# Patient Record
Sex: Female | Born: 1955 | Race: Black or African American | Hispanic: No | Marital: Single | Smoking: Never smoker
Health system: Southern US, Community
[De-identification: ages and names within clinical notes are randomized; demographics above are authoritative.]

## PROBLEM LIST (undated history)

## (undated) DIAGNOSIS — E7800 Pure hypercholesterolemia, unspecified: Secondary | ICD-10-CM

## (undated) DIAGNOSIS — M329 Systemic lupus erythematosus, unspecified: Secondary | ICD-10-CM

## (undated) DIAGNOSIS — I219 Acute myocardial infarction, unspecified: Secondary | ICD-10-CM

## (undated) DIAGNOSIS — I2510 Atherosclerotic heart disease of native coronary artery without angina pectoris: Secondary | ICD-10-CM

## (undated) DIAGNOSIS — K5732 Diverticulitis of large intestine without perforation or abscess without bleeding: Secondary | ICD-10-CM

## (undated) DIAGNOSIS — IMO0002 Reserved for concepts with insufficient information to code with codable children: Secondary | ICD-10-CM

## (undated) DIAGNOSIS — M719 Bursopathy, unspecified: Secondary | ICD-10-CM

## (undated) HISTORY — PX: OVARY SURGERY: SHX727

## (undated) HISTORY — PX: CHOLECYSTECTOMY: SHX55

## (undated) HISTORY — PX: FOOT SURGERY: SHX648

## (undated) HISTORY — PX: ROTATOR CUFF REPAIR: SHX139

---

## 2006-02-06 ENCOUNTER — Encounter: Admission: RE | Admit: 2006-02-06 | Discharge: 2006-05-07 | Payer: Self-pay | Admitting: "Specialist/Technologist

## 2007-07-18 ENCOUNTER — Emergency Department (HOSPITAL_COMMUNITY): Admission: EM | Admit: 2007-07-18 | Discharge: 2007-07-19 | Payer: Self-pay | Admitting: Emergency Medicine

## 2008-10-09 ENCOUNTER — Emergency Department (HOSPITAL_BASED_OUTPATIENT_CLINIC_OR_DEPARTMENT_OTHER): Admission: EM | Admit: 2008-10-09 | Discharge: 2008-10-10 | Payer: Self-pay | Admitting: Emergency Medicine

## 2009-08-18 ENCOUNTER — Ambulatory Visit: Payer: Self-pay | Admitting: Diagnostic Radiology

## 2009-08-18 ENCOUNTER — Emergency Department (HOSPITAL_BASED_OUTPATIENT_CLINIC_OR_DEPARTMENT_OTHER): Admission: EM | Admit: 2009-08-18 | Discharge: 2009-08-18 | Payer: Self-pay | Admitting: Emergency Medicine

## 2009-11-14 ENCOUNTER — Emergency Department (HOSPITAL_BASED_OUTPATIENT_CLINIC_OR_DEPARTMENT_OTHER): Admission: EM | Admit: 2009-11-14 | Discharge: 2009-11-15 | Payer: Self-pay | Admitting: Emergency Medicine

## 2010-07-01 LAB — URINALYSIS, ROUTINE W REFLEX MICROSCOPIC
Bilirubin Urine: NEGATIVE
Hgb urine dipstick: NEGATIVE
Ketones, ur: NEGATIVE mg/dL
Protein, ur: NEGATIVE mg/dL
Specific Gravity, Urine: 1.015 (ref 1.005–1.030)
pH: 7 (ref 5.0–8.0)

## 2010-07-01 LAB — DIFFERENTIAL
Basophils Relative: 3 % — ABNORMAL HIGH (ref 0–1)
Eosinophils Absolute: 0.1 10*3/uL (ref 0.0–0.7)
Lymphocytes Relative: 38 % (ref 12–46)
Monocytes Relative: 6 % (ref 3–12)

## 2010-07-01 LAB — COMPREHENSIVE METABOLIC PANEL
AST: 30 U/L (ref 0–37)
Alkaline Phosphatase: 66 U/L (ref 39–117)
CO2: 30 mEq/L (ref 19–32)
Calcium: 8.9 mg/dL (ref 8.4–10.5)
Chloride: 105 mEq/L (ref 96–112)
Potassium: 4.3 mEq/L (ref 3.5–5.1)
Total Bilirubin: 1.3 mg/dL — ABNORMAL HIGH (ref 0.3–1.2)
Total Protein: 7.6 g/dL (ref 6.0–8.3)

## 2010-07-01 LAB — CBC
MCH: 31.4 pg (ref 26.0–34.0)
Platelets: 195 10*3/uL (ref 150–400)
RDW: 14.4 % (ref 11.5–15.5)

## 2010-07-01 LAB — LIPASE, BLOOD: Lipase: 94 U/L (ref 23–300)

## 2010-07-01 LAB — URINE MICROSCOPIC-ADD ON

## 2010-07-05 LAB — POCT CARDIAC MARKERS
CKMB, poc: <1 ng/mL — ABNORMAL LOW (ref 1.0–8.0)
Myoglobin, poc: 45.8 ng/mL (ref 12–200)

## 2010-07-05 LAB — COMPREHENSIVE METABOLIC PANEL
ALT: 22 U/L (ref 0–35)
AST: 19 U/L (ref 0–37)
Alkaline Phosphatase: 89 U/L (ref 39–117)
BUN: 11 mg/dL (ref 6–23)
CO2: 26 mEq/L (ref 19–32)
Creatinine, Ser: 0.9 mg/dL (ref 0.4–1.2)
Potassium: 4.3 mEq/L (ref 3.5–5.1)
Total Bilirubin: 0.7 mg/dL (ref 0.3–1.2)
Total Protein: 7.9 g/dL (ref 6.0–8.3)

## 2010-07-05 LAB — URINE MICROSCOPIC-ADD ON

## 2010-07-05 LAB — CBC
HCT: 43.3 % (ref 36.0–46.0)
Hemoglobin: 14.1 g/dL (ref 12.0–15.0)
MCHC: 32.6 g/dL (ref 30.0–36.0)
Platelets: 232 10*3/uL (ref 150–400)
RBC: 4.59 MIL/uL (ref 3.87–5.11)

## 2010-07-05 LAB — URINALYSIS, ROUTINE W REFLEX MICROSCOPIC
Bilirubin Urine: NEGATIVE
Glucose, UA: NEGATIVE mg/dL
Hgb urine dipstick: NEGATIVE
Ketones, ur: NEGATIVE mg/dL

## 2010-07-05 LAB — DIFFERENTIAL
Basophils Absolute: 0 10*3/uL (ref 0.0–0.1)
Eosinophils Relative: 1 % (ref 0–5)
Lymphocytes Relative: 49 % — ABNORMAL HIGH (ref 12–46)
Lymphs Abs: 3.4 10*3/uL (ref 0.7–4.0)
Monocytes Absolute: 0.5 10*3/uL (ref 0.1–1.0)
Monocytes Relative: 8 % (ref 3–12)
Neutrophils Relative %: 42 % — ABNORMAL LOW (ref 43–77)

## 2010-07-25 LAB — CBC
HCT: 44.1 % (ref 36.0–46.0)
MCV: 93.9 fL (ref 78.0–100.0)
Platelets: 228 10*3/uL (ref 150–400)
RDW: 14.1 % (ref 11.5–15.5)
WBC: 7.6 10*3/uL (ref 4.0–10.5)

## 2010-07-25 LAB — COMPREHENSIVE METABOLIC PANEL
ALT: 19 U/L (ref 0–35)
AST: 21 U/L (ref 0–37)
Calcium: 8.8 mg/dL (ref 8.4–10.5)
Chloride: 105 mEq/L (ref 96–112)
Creatinine, Ser: 1 mg/dL (ref 0.4–1.2)
GFR calc Af Amer: >60 mL/min (ref >60)
GFR calc non Af Amer: 58 mL/min — ABNORMAL LOW (ref >60)
Sodium: 143 mEq/L (ref 135–145)
Total Protein: 7.4 g/dL (ref 6.0–8.3)

## 2010-07-25 LAB — URINALYSIS, ROUTINE W REFLEX MICROSCOPIC
Glucose, UA: NEGATIVE mg/dL
Hgb urine dipstick: NEGATIVE
Nitrite: NEGATIVE

## 2010-07-25 LAB — DIFFERENTIAL
Basophils Absolute: 0.3 10*3/uL — ABNORMAL HIGH (ref 0.0–0.1)
Eosinophils Absolute: 0.1 10*3/uL (ref 0.0–0.7)
Lymphocytes Relative: 38 % (ref 12–46)
Monocytes Absolute: 0.6 10*3/uL (ref 0.1–1.0)

## 2010-07-25 LAB — HEMOCCULT GUIAC POC 1CARD (OFFICE): Fecal Occult Bld: NEGATIVE

## 2010-07-25 LAB — LIPASE, BLOOD: Lipase: 179 U/L (ref 23–300)

## 2010-11-29 ENCOUNTER — Other Ambulatory Visit: Payer: Self-pay | Admitting: Specialist

## 2010-11-29 DIAGNOSIS — M545 Low back pain: Secondary | ICD-10-CM

## 2010-11-30 ENCOUNTER — Other Ambulatory Visit: Payer: Self-pay

## 2011-01-10 LAB — CBC
HCT: 44
Hemoglobin: 14.9
MCHC: 33.8
MCV: 93.1
Platelets: 224
RBC: 4.72
RDW: 14.4
WBC: 8.1

## 2011-01-10 LAB — DIFFERENTIAL
Basophils Relative: 0
Eosinophils Absolute: 0.1
Monocytes Absolute: 0.6
Monocytes Relative: 8

## 2011-01-10 LAB — COMPREHENSIVE METABOLIC PANEL
Albumin: 3.9
Alkaline Phosphatase: 80
CO2: 28
GFR calc Af Amer: >60
Potassium: 3.6
Sodium: 136

## 2011-01-10 LAB — POCT CARDIAC MARKERS: Myoglobin, poc: 54

## 2011-03-15 ENCOUNTER — Encounter: Payer: Self-pay | Admitting: *Deleted

## 2011-03-15 ENCOUNTER — Emergency Department (INDEPENDENT_AMBULATORY_CARE_PROVIDER_SITE_OTHER): Payer: Medicare Other

## 2011-03-15 ENCOUNTER — Emergency Department (HOSPITAL_BASED_OUTPATIENT_CLINIC_OR_DEPARTMENT_OTHER)
Admission: EM | Admit: 2011-03-15 | Discharge: 2011-03-15 | Discharge status: Against Medical Advice | Payer: Medicare Other | Attending: Emergency Medicine | Admitting: Emergency Medicine

## 2011-03-15 DIAGNOSIS — R05 Cough: Secondary | ICD-10-CM | POA: Insufficient documentation

## 2011-03-15 DIAGNOSIS — R079 Chest pain, unspecified: Secondary | ICD-10-CM | POA: Insufficient documentation

## 2011-03-15 DIAGNOSIS — R0602 Shortness of breath: Secondary | ICD-10-CM

## 2011-03-15 DIAGNOSIS — R059 Cough, unspecified: Secondary | ICD-10-CM | POA: Insufficient documentation

## 2011-03-15 HISTORY — DX: Bursopathy, unspecified: M71.9

## 2011-03-15 HISTORY — DX: Pure hypercholesterolemia, unspecified: E78.00

## 2011-03-15 NOTE — ED Provider Notes (Signed)
8:21 PM  Date: 03/15/2011  Rate:84  Rhythm: normal sinus rhythm and sinus arrhythmia  QRS Axis: normal  Intervals: normal  ST/T Wave abnormalities: nonspecific ST changes  Conduction Disutrbances:none  Narrative Interpretation: Abnormal EKG.  Old EKG Reviewed: unchanged    Carleene Cooper III, MD 03/15/11 2022

## 2011-03-15 NOTE — ED Notes (Signed)
resp at bs, pt assessed.

## 2011-03-15 NOTE — ED Notes (Signed)
Pt c/o center chest pain with cough. Has ran out of inhaler and needs a home nebulizer per pt.

## 2011-03-15 NOTE — ED Notes (Signed)
Pt not found in room, gown noticed on bed. NS states that she saw pt ambulate out of dept in NAD.

## 2011-03-15 NOTE — ED Notes (Signed)
Patient transported to X-ray 

## 2011-07-25 ENCOUNTER — Other Ambulatory Visit: Payer: Self-pay | Admitting: Specialist

## 2011-07-25 DIAGNOSIS — M545 Low back pain: Secondary | ICD-10-CM

## 2011-07-30 ENCOUNTER — Encounter (HOSPITAL_BASED_OUTPATIENT_CLINIC_OR_DEPARTMENT_OTHER): Payer: Self-pay | Admitting: *Deleted

## 2011-07-30 ENCOUNTER — Emergency Department (INDEPENDENT_AMBULATORY_CARE_PROVIDER_SITE_OTHER): Payer: Medicare Other

## 2011-07-30 ENCOUNTER — Emergency Department (HOSPITAL_BASED_OUTPATIENT_CLINIC_OR_DEPARTMENT_OTHER)
Admission: EM | Admit: 2011-07-30 | Discharge: 2011-07-31 | Disposition: A | Discharge status: Home / Self Care | Payer: Medicare Other | Attending: Emergency Medicine | Admitting: Emergency Medicine

## 2011-07-30 DIAGNOSIS — R079 Chest pain, unspecified: Secondary | ICD-10-CM | POA: Insufficient documentation

## 2011-07-30 DIAGNOSIS — R0602 Shortness of breath: Secondary | ICD-10-CM | POA: Insufficient documentation

## 2011-07-30 DIAGNOSIS — R110 Nausea: Secondary | ICD-10-CM | POA: Insufficient documentation

## 2011-07-30 DIAGNOSIS — R51 Headache: Secondary | ICD-10-CM | POA: Insufficient documentation

## 2011-07-30 DIAGNOSIS — E876 Hypokalemia: Secondary | ICD-10-CM

## 2011-07-30 DIAGNOSIS — J45909 Unspecified asthma, uncomplicated: Secondary | ICD-10-CM | POA: Insufficient documentation

## 2011-07-30 DIAGNOSIS — I10 Essential (primary) hypertension: Secondary | ICD-10-CM | POA: Insufficient documentation

## 2011-07-30 LAB — CBC
Hemoglobin: 15.9 g/dL — ABNORMAL HIGH (ref 12.0–15.0)
MCV: 89 fL (ref 78.0–100.0)
Platelets: 267 10*3/uL (ref 150–400)
RBC: 5.07 MIL/uL (ref 3.87–5.11)
WBC: 9.2 10*3/uL (ref 4.0–10.5)

## 2011-07-30 LAB — COMPREHENSIVE METABOLIC PANEL
ALT: 29 U/L (ref 0–35)
AST: 22 U/L (ref 0–37)
CO2: 29 mEq/L (ref 19–32)
Chloride: 96 mEq/L (ref 96–112)
Creatinine, Ser: 1.2 mg/dL — ABNORMAL HIGH (ref 0.50–1.10)
GFR calc non Af Amer: 50 mL/min — ABNORMAL LOW (ref >90)
Glucose, Bld: 132 mg/dL — ABNORMAL HIGH (ref 70–99)
Sodium: 137 mEq/L (ref 135–145)
Total Bilirubin: 0.5 mg/dL (ref 0.3–1.2)

## 2011-07-30 MED ORDER — NITROGLYCERIN 0.4 MG SL SUBL
0.4000 mg | SUBLINGUAL_TABLET | SUBLINGUAL | Status: DC | PRN
  Administered 2011-07-31: 0.4 mg via SUBLINGUAL
  Filled 2011-07-30: qty 25

## 2011-07-30 MED ORDER — POTASSIUM CHLORIDE 20 MEQ/15ML (10%) PO LIQD
20.0000 meq | Freq: Once | ORAL | Status: AC
  Administered 2011-07-30: 20 meq via ORAL
  Filled 2011-07-30: qty 15

## 2011-07-30 NOTE — ED Provider Notes (Signed)
History   Scribed for Hanley Seamen, MD, the patient was seen in room MH03/MH03 . This chart was scribed by Lewanda Rife.    CSN: [id number]  Arrival date & time 07/30/11  2231   First MD Initiated Contact with Patient 07/30/11 2315      Chief Complaint  Patient presents with  . Chest Pain    (Consider location/radiation/quality/duration/timing/severity/associated sxs/prior treatment) HPI Norma Miller is a 57 y.o. female who presents to the Emergency Department complaining of constant chest pain since 6 hours prior to arrival. Pt reports associated nausea, and mild shortness of breath. Pt states chest pain "feels like dog is sitting on chest." Pt reports the shortness of breath is the same with exertion and at rest. Pt additionally reports headache for the past 7 days. Pt describes the headaches as constant, pounding, and pain behind eyes. Pt reports she does not have a hx of headaches. Pt states she was prescribed hydrochlorothiazide yesterday for hypertension and states headache symptoms have improved since taking Rx. Pt denies any cardiac hx. Pt denies taking any aspirin or nitroglycerin prior to arrival.   Past Medical History  Diagnosis Date  . Asthma   . High cholesterol   . Bursitis   . Hypertension     History reviewed. No pertinent past surgical history.  No family history on file.  History  Substance Use Topics  . Smoking status: Never Smoker   . Smokeless tobacco: Not on file  . Alcohol Use: No    OB History    Grav Para Term Preterm Abortions TAB SAB Ect Mult Living                  Review of Systems  Eyes: Positive for pain. Negative for photophobia, discharge and visual disturbance.  Respiratory: Positive for shortness of breath.   Cardiovascular: Positive for chest pain.  Gastrointestinal: Positive for nausea. Negative for vomiting.  Neurological: Positive for headaches.  All other systems reviewed and are negative.    Allergies    Compazine; Flucytosine; Keflex; Macrobid; and Welchol  Home Medications   Current Outpatient Rx  Name Route Sig Dispense Refill  . ALBUTEROL SULFATE HFA 108 (90 BASE) MCG/ACT IN AERS Inhalation Inhale 2 puffs into the lungs every 6 (six) hours as needed.    . BECLOMETHASONE DIPROPIONATE 40 MCG/ACT IN AERS Inhalation Inhale 2 puffs into the lungs 2 (two) times daily.    Marland Kitchen HYDROCHLOROTHIAZIDE 25 MG PO TABS Oral Take 25 mg by mouth daily. Patient stated that she took this medication today as well.    . IBUPROFEN 800 MG PO TABS Oral Take 800 mg by mouth every 8 (eight) hours as needed.    Marland Kitchen LAMOTRIGINE 25 MG PO TABS Oral Take 25 mg by mouth daily.    Marland Kitchen LANSOPRAZOLE 15 MG PO CPDR Oral Take 15 mg by mouth daily.    . SERTRALINE HCL 25 MG PO TABS Oral Take 25 mg by mouth daily.    . SUCRALFATE 1 G PO TABS Oral Take 1 g by mouth 4 (four) times daily.      BP 126/91  Pulse 78  Temp(Src) 97.4 F (36.3 C) (Oral)  Resp 17  Ht 5\' 1"  (1.549 m)  Wt 185 lb (83.915 kg)  BMI 34.96 kg/m2  SpO2 99%  Physical Exam  Nursing note and vitals reviewed. Constitutional: She is oriented to person, place, and time. She appears well-developed and well-nourished.  HENT:  Head: Normocephalic and atraumatic.  Mouth/Throat:  Oropharynx is clear and moist.  Eyes: Conjunctivae and EOM are normal. Pupils are equal, round, and reactive to light.  Neck: Normal range of motion. Neck supple. No tracheal deviation present. No thyromegaly present.  Cardiovascular: Normal rate and regular rhythm.   No murmur heard. Pulmonary/Chest: Effort normal and breath sounds normal. She has no wheezes. She has no rales.       Bilateral parasternal tenderness  Abdominal: Soft. Bowel sounds are normal. She exhibits no distension. There is no tenderness.  Musculoskeletal: Normal range of motion. She exhibits no edema and no tenderness.       Right ankle an orthodic device noted   Neurological: She is alert and oriented to person,  place, and time. Coordination normal.  Skin: Skin is warm and dry. No rash noted.  Psychiatric: She has a normal mood and affect.    ED Course  Procedures (including critical care time)     MDM  EKG Interpretation:  Date & Time: 07/30/2011 10:40 PM  Rate: 97  Rhythm: normal sinus rhythm  QRS Axis: normal  Intervals: normal  ST/T Wave abnormalities: normal  Conduction Disutrbances:none  Narrative Interpretation:   Old EKG Reviewed: unchanged  Nursing notes and vitals signs, including pulse oximetry, reviewed.  Summary of this visit's results, reviewed by myself:  Labs:  Results for orders placed during the hospital encounter of 07/30/11  CBC      Component Value Range   WBC 9.2  4.0 - 10.5 (K/uL)   RBC 5.07  3.87 - 5.11 (MIL/uL)   Hemoglobin 15.9 (*) 12.0 - 15.0 (g/dL)   HCT 16.1  09.6 - 04.5 (%)   MCV 89.0  78.0 - 100.0 (fL)   MCH 31.4  26.0 - 34.0 (pg)   MCHC 35.3  30.0 - 36.0 (g/dL)   RDW 40.9  81.1 - 91.4 (%)   Platelets 267  150 - 400 (K/uL)  COMPREHENSIVE METABOLIC PANEL      Component Value Range   Sodium 137  135 - 145 (mEq/L)   Potassium 3.0 (*) 3.5 - 5.1 (mEq/L)   Chloride 96  96 - 112 (mEq/L)   CO2 29  19 - 32 (mEq/L)   Glucose, Bld 132 (*) 70 - 99 (mg/dL)   BUN 16  6 - 23 (mg/dL)   Creatinine, Ser 7.82 (*) 0.50 - 1.10 (mg/dL)   Calcium 95.6  8.4 - 10.5 (mg/dL)   Total Protein 8.0  6.0 - 8.3 (g/dL)   Albumin 3.9  3.5 - 5.2 (g/dL)   AST 22  0 - 37 (U/L)   ALT 29  0 - 35 (U/L)   Alkaline Phosphatase 84  39 - 117 (U/L)   Total Bilirubin 0.5  0.3 - 1.2 (mg/dL)   GFR calc non Af Amer 50 (*) >90 (mL/min)   GFR calc Af Amer 58 (*) >90 (mL/min)  TROPONIN I      Component Value Range   Troponin I <0.30  <0.30 (ng/mL)  DIFFERENTIAL      Component Value Range   Neutrophils Relative 47  43 - 77 (%)   Neutro Abs 4.3  1.7 - 7.7 (K/uL)   Lymphocytes Relative 46  12 - 46 (%)   Lymphs Abs 4.2 (*) 0.7 - 4.0 (K/uL)   Monocytes Relative 7  3 - 12 (%)    Monocytes Absolute 0.6  0.1 - 1.0 (K/uL)   Eosinophils Relative 0  0 - 5 (%)   Eosinophils Absolute 0.0  0.0 - 0.7 (  K/uL)   Basophils Relative 0  0 - 1 (%)   Basophils Absolute 0.0  0.0 - 0.1 (K/uL)  TROPONIN I      Component Value Range   Troponin I <0.30  <0.30 (ng/mL)    Imaging Studies: Dg Chest 2 View  07/30/2011  *RADIOLOGY REPORT*  Clinical Data: Chest pain.  CHEST - 2 VIEW  Comparison: Chest radiograph performed 03/15/2011  Findings: The lungs are well-aerated and clear.  There is no evidence of focal opacification, pleural effusion or pneumothorax.  The heart is normal in size; the mediastinal contour is within normal limits.  No acute osseous abnormalities are seen.  Clips are noted within the right upper quadrant, reflecting prior cholecystectomy.  IMPRESSION: No acute cardiopulmonary process seen.  Original Report Authenticated By: Tonia Ghent, M.D.    3:05 AM No change in chest pain with nitroglycerin. Patient's pain was relieved after being given 600 mg of ibuprofen by mouth. She does not wish to be admitted at this time. She prefers to followup with her cardiologist Dr. Shelva Majestic, whom she has seen in the past for PVCs.      I personally performed the services described in this documentation, which was scribed in my presence.  The recorded information has been reviewed and considered.    Hanley Seamen, MD 07/31/11 [phone]

## 2011-07-30 NOTE — ED Notes (Signed)
Pt presents to ED today with substernal chest pain that started while lying down.  Pt reports 5/10 on scale

## 2011-07-31 LAB — DIFFERENTIAL
Basophils Relative: 0 % (ref 0–1)
Eosinophils Absolute: 0 10*3/uL (ref 0.0–0.7)
Eosinophils Relative: 0 % (ref 0–5)
Monocytes Absolute: 0.6 10*3/uL (ref 0.1–1.0)
Monocytes Relative: 7 % (ref 3–12)

## 2011-07-31 MED ORDER — IBUPROFEN 800 MG PO TABS: 800.0000 mg | ORAL_TABLET | Freq: Once | ORAL | Status: DC

## 2011-07-31 MED ORDER — IBUPROFEN 200 MG PO TABS
ORAL_TABLET | ORAL | Status: AC
  Administered 2011-07-31: 600 mg via ORAL
  Filled 2011-07-31: qty 1

## 2011-07-31 MED ORDER — POTASSIUM CHLORIDE ER 10 MEQ PO TBCR: 20.0000 meq | EXTENDED_RELEASE_TABLET | Freq: Two times a day (BID) | ORAL | Status: AC

## 2011-07-31 MED ORDER — IBUPROFEN 400 MG PO TABS
ORAL_TABLET | ORAL | Status: AC
  Filled 2011-07-31: qty 1

## 2011-07-31 MED ORDER — FENTANYL CITRATE 0.05 MG/ML IJ SOLN
50.0000 ug | Freq: Once | INTRAMUSCULAR | Status: DC
  Filled 2011-07-31: qty 2

## 2011-07-31 NOTE — Discharge Instructions (Signed)
Chest Pain (Nonspecific) It is often hard to give a specific diagnosis for the cause of chest pain. There is always a chance that your pain could be related to something serious, such as a heart attack or a blood clot in the lungs. You need to follow up with your caregiver for further evaluation. CAUSES   Heartburn.   Pneumonia or bronchitis.   Anxiety or stress.   Inflammation around your heart (pericarditis) or lung (pleuritis or pleurisy).   A blood clot in the lung.   A collapsed lung (pneumothorax). It can develop suddenly on its own (spontaneous pneumothorax) or from injury (trauma) to the chest.   Shingles infection (herpes zoster virus).  The chest wall is composed of bones, muscles, and cartilage. Any of these can be the source of the pain.  The bones can be bruised by injury.   The muscles or cartilage can be strained by coughing or overwork.   The cartilage can be affected by inflammation and become sore (costochondritis).  DIAGNOSIS  Lab tests or other studies, such as X-rays, electrocardiography, stress testing, or cardiac imaging, may be needed to find the cause of your pain.  TREATMENT   Treatment depends on what may be causing your chest pain. Treatment may include:   Acid blockers for heartburn.   Anti-inflammatory medicine.   Pain medicine for inflammatory conditions.   Antibiotics if an infection is present.   You may be advised to change lifestyle habits. This includes stopping smoking and avoiding alcohol, caffeine, and chocolate.   You may be advised to keep your head raised (elevated) when sleeping. This reduces the chance of acid going backward from your stomach into your esophagus.   Most of the time, nonspecific chest pain will improve within 2 to 3 days with rest and mild pain medicine.  HOME CARE INSTRUCTIONS   If antibiotics were prescribed, take your antibiotics as directed. Finish them even if you start to feel better.   For the next few  days, avoid physical activities that bring on chest pain. Continue physical activities as directed.   Do not smoke.   Avoid drinking alcohol.   Only take over-the-counter or prescription medicine for pain, discomfort, or fever as directed by your caregiver.   Follow your caregiver's suggestions for further testing if your chest pain does not go away.   Keep any follow-up appointments you made. If you do not go to an appointment, you could develop lasting (chronic) problems with pain. If there is any problem keeping an appointment, you must call to reschedule.  SEEK MEDICAL CARE IF:   You think you are having problems from the medicine you are taking. Read your medicine instructions carefully.   Your chest pain does not go away, even after treatment.   You develop a rash with blisters on your chest.  SEEK IMMEDIATE MEDICAL CARE IF:   You have increased chest pain or pain that spreads to your arm, neck, jaw, back, or abdomen.   You develop shortness of breath, an increasing cough, or you are coughing up blood.   You have severe back or abdominal pain, feel nauseous, or vomit.   You develop severe weakness, fainting, or chills.   You have a fever.  THIS IS AN EMERGENCY. Do not wait to see if the pain will go away. Get medical help at once. Call your local emergency services (911 in [place] Do not drive yourself to the hospital. MAKE SURE YOU:   Understand these instructions.     Will watch your condition.   Will get help right away if you are not doing well or get worse.  Document Released: 01/11/2005 Document Revised: 03/23/2011 Document Reviewed: 11/07/2007 [employer] Patient Information 2012 [employer]. 

## 2011-07-31 NOTE — ED Notes (Signed)
Patient was given SL nitro x 1. Within a minute or so the patient began saying that she "didn't feel right". Her HR increased into the 120's and her o2 decreased to 95%. Dr. Read Drivers made aware and an EKG ordered.

## 2012-02-04 ENCOUNTER — Emergency Department (HOSPITAL_BASED_OUTPATIENT_CLINIC_OR_DEPARTMENT_OTHER)
Admission: EM | Admit: 2012-02-04 | Discharge: 2012-02-04 | Disposition: A | Discharge status: Home / Self Care | Payer: Medicare Other | Attending: Emergency Medicine | Admitting: Emergency Medicine

## 2012-02-04 ENCOUNTER — Encounter (HOSPITAL_BASED_OUTPATIENT_CLINIC_OR_DEPARTMENT_OTHER): Payer: Self-pay | Admitting: *Deleted

## 2012-02-04 ENCOUNTER — Emergency Department (HOSPITAL_BASED_OUTPATIENT_CLINIC_OR_DEPARTMENT_OTHER): Payer: Medicare Other

## 2012-02-04 DIAGNOSIS — J45909 Unspecified asthma, uncomplicated: Secondary | ICD-10-CM | POA: Insufficient documentation

## 2012-02-04 DIAGNOSIS — E7800 Pure hypercholesterolemia, unspecified: Secondary | ICD-10-CM | POA: Insufficient documentation

## 2012-02-04 DIAGNOSIS — J40 Bronchitis, not specified as acute or chronic: Secondary | ICD-10-CM | POA: Insufficient documentation

## 2012-02-04 MED ORDER — PREDNISONE 20 MG PO TABS: 60.0000 mg | ORAL_TABLET | Freq: Every day | ORAL | Status: DC

## 2012-02-04 MED ORDER — PREDNISONE 50 MG PO TABS
60.0000 mg | ORAL_TABLET | Freq: Once | ORAL | Status: AC
  Administered 2012-02-04: 60 mg via ORAL
  Filled 2012-02-04: qty 1

## 2012-02-04 NOTE — ED Notes (Signed)
Returned from xray

## 2012-02-04 NOTE — ED Notes (Signed)
Pt states she has been having s/s since Oct 11. Dx'd with sinusitis. Given antibiotic, but it did not clr up. Seen at [facility]. Given shot, inhalers and tx, but still having problems.

## 2012-02-04 NOTE — ED Provider Notes (Signed)
History  This chart was scribed for Norma Petko B. Bernette Mayers, MD by Shari Heritage. The patient was seen in room MH02/MH02. Patient's care was started at 2048.     CSN: [id number]  Arrival date & time 02/04/12  2024   First MD Initiated Contact with Patient 02/04/12 2048      Chief Complaint  Patient presents with  . Shortness of Breath     The history is provided by the patient. No language interpreter was used.    Norma Miller is a 56 y.o. female who presents to the Emergency Department complaining of continuous moderate SOB onset 9 days ago. There is associated nasal congestion, wheezing, and pressure in both ears. She denies fever or cough. Patient was seen at [facility] on 10/11 where she was prescribed amoxicillin to treat sinusitis. Patient states that she did not improve much so she went back on Friday 10/18. She received two nebulizer treatments (Qvar and Proventil) and a shot of prednisone. Patient was also prescribed a Z-pak and she has 2 days left. Patient also states that she used her inhaler one time PTA in the ED. She has a history asthma, pneumothorax (at 56 years old), high cholesterol and bursitis.   Past Medical History  Diagnosis Date  . Asthma   . High cholesterol   . Bursitis     History reviewed. No pertinent past surgical history.  History reviewed. No pertinent family history.  History  Substance Use Topics  . Smoking status: Never Smoker   . Smokeless tobacco: Not on file  . Alcohol Use: No    OB History    Grav Para Term Preterm Abortions TAB SAB Ect Mult Living                  Review of Systems A complete 10 system review of systems was obtained and all systems are negative except as noted in the HPI and PMH.   Allergies  Cephalexin; Compazine; Flucytosine; Nitrofurantoin monohyd macro; and Welchol  Home Medications   Current Outpatient Rx  Name Route Sig Dispense Refill  . ALBUTEROL SULFATE HFA 108 (90 BASE) MCG/ACT IN AERS Inhalation  Inhale 2 puffs into the lungs every 6 (six) hours as needed.    . BECLOMETHASONE DIPROPIONATE 40 MCG/ACT IN AERS Inhalation Inhale 2 puffs into the lungs 2 (two) times daily.    Marland Kitchen HYDROCHLOROTHIAZIDE 25 MG PO TABS Oral Take 25 mg by mouth daily. Patient stated that she took this medication today as well.    . IBUPROFEN 800 MG PO TABS Oral Take 800 mg by mouth every 8 (eight) hours as needed.    Marland Kitchen LAMOTRIGINE 25 MG PO TABS Oral Take 25 mg by mouth daily.    Marland Kitchen LANSOPRAZOLE 15 MG PO CPDR Oral Take 15 mg by mouth daily.    Marland Kitchen POTASSIUM CHLORIDE ER 10 MEQ PO TBCR Oral Take 2 tablets (20 mEq total) by mouth 2 (two) times daily. 10 tablet 0  . SERTRALINE HCL 25 MG PO TABS Oral Take 25 mg by mouth daily.    . SUCRALFATE 1 G PO TABS Oral Take 1 g by mouth 4 (four) times daily.      BP 127/72  Pulse 77  Temp 98.1 F (36.7 C) (Oral)  Resp 20  Ht 5' (1.524 m)  Wt 182 lb (82.555 kg)  BMI 35.54 kg/m2  SpO2 98%  Physical Exam  Nursing note and vitals reviewed. Constitutional: She is oriented to person, place, and time. She appears  well-developed and well-nourished.  HENT:  Head: Normocephalic and atraumatic.  Eyes: EOM are normal. Pupils are equal, round, and reactive to light.  Neck: Normal range of motion. Neck supple.  Cardiovascular: Normal rate, normal heart sounds and intact distal pulses.   Pulmonary/Chest: Effort normal and breath sounds normal.  Abdominal: Bowel sounds are normal. She exhibits no distension. There is no tenderness.  Musculoskeletal: Normal range of motion. She exhibits no edema and no tenderness.  Neurological: She is alert and oriented to person, place, and time. She has normal strength. No cranial nerve deficit or sensory deficit.  Skin: Skin is warm and dry. No rash noted.  Psychiatric: She has a normal mood and affect.     ED Course  Procedures (including critical care time) DIAGNOSTIC STUDIES: Oxygen Saturation is 98% on room air, normal by my interpretation.      COORDINATION OF CARE: 2102- Patient informed of current plan for treatment and evaluation and agrees with plan at this time.     Labs Reviewed - No data to display  Dg Chest 2 View  02/04/2012  *RADIOLOGY REPORT*  Clinical Data: Shortness of breath  CHEST - 2 VIEW  Comparison: 07/30/2011  Findings: Eventration right hemidiaphragm. Lungs are clear. No pleural effusion or pneumothorax. The cardiomediastinal contours are within normal limits. The visualized bones and soft tissues are without significant appreciable abnormality.  Surgical clips right upper quadrant.  IMPRESSION: No radiographic evidence of acute cardiopulmonary process.   Original Report Authenticated By: Waneta Martins, M.D.      No diagnosis found.    MDM  CXR neg as above. Pt with bronchitis, likely viral in etiology. Has been on Abx without improvement. Pt reports she has had good improvement in symptoms previously with steroids. Advised to followup with PCP.       I personally performed the services described in the documentation, which were scribed in my presence. The recorded information has been reviewed and considered.     Kinney Sackmann B. Bernette Mayers, MD 02/04/12 2209

## 2012-04-29 ENCOUNTER — Encounter (HOSPITAL_BASED_OUTPATIENT_CLINIC_OR_DEPARTMENT_OTHER): Payer: Self-pay | Admitting: *Deleted

## 2012-04-29 ENCOUNTER — Emergency Department (HOSPITAL_BASED_OUTPATIENT_CLINIC_OR_DEPARTMENT_OTHER)
Admission: EM | Admit: 2012-04-29 | Discharge: 2012-04-30 | Disposition: A | Discharge status: Home / Self Care | Payer: Medicare Other | Attending: Emergency Medicine | Admitting: Emergency Medicine

## 2012-04-29 DIAGNOSIS — Z9089 Acquired absence of other organs: Secondary | ICD-10-CM | POA: Insufficient documentation

## 2012-04-29 DIAGNOSIS — Z79899 Other long term (current) drug therapy: Secondary | ICD-10-CM | POA: Insufficient documentation

## 2012-04-29 DIAGNOSIS — Z8739 Personal history of other diseases of the musculoskeletal system and connective tissue: Secondary | ICD-10-CM | POA: Insufficient documentation

## 2012-04-29 DIAGNOSIS — J45909 Unspecified asthma, uncomplicated: Secondary | ICD-10-CM | POA: Insufficient documentation

## 2012-04-29 DIAGNOSIS — R109 Unspecified abdominal pain: Secondary | ICD-10-CM | POA: Insufficient documentation

## 2012-04-29 DIAGNOSIS — E7800 Pure hypercholesterolemia, unspecified: Secondary | ICD-10-CM | POA: Insufficient documentation

## 2012-04-29 DIAGNOSIS — IMO0002 Reserved for concepts with insufficient information to code with codable children: Secondary | ICD-10-CM | POA: Insufficient documentation

## 2012-04-29 HISTORY — DX: Diverticulitis of large intestine without perforation or abscess without bleeding: K57.32

## 2012-04-29 LAB — URINALYSIS, ROUTINE W REFLEX MICROSCOPIC
Bilirubin Urine: NEGATIVE
Protein, ur: NEGATIVE mg/dL
Urobilinogen, UA: 0.2 mg/dL (ref 0.0–1.0)

## 2012-04-29 NOTE — ED Notes (Signed)
Pt c/o  Left abd pain x 2 weeks, dx diverticulitis by PMD x 2 weeks ago, completed cipro and flagyl .

## 2012-04-29 NOTE — ED Notes (Signed)
Pt reports that she is followed by [facility] in [place] for her diverticulitis, completed antibiotics today, pt reports that that this evening she could feel abdomen swelling again and has had severe pain , on and off

## 2012-04-29 NOTE — ED Notes (Signed)
Last colonoscopy was in august.

## 2012-04-30 ENCOUNTER — Emergency Department (HOSPITAL_BASED_OUTPATIENT_CLINIC_OR_DEPARTMENT_OTHER): Payer: Medicare Other

## 2012-04-30 LAB — COMPREHENSIVE METABOLIC PANEL
Albumin: 3.6 g/dL (ref 3.5–5.2)
Alkaline Phosphatase: 50 U/L (ref 39–117)
BUN: 10 mg/dL (ref 6–23)
Calcium: 9.1 mg/dL (ref 8.4–10.5)
Creatinine, Ser: 0.8 mg/dL (ref 0.50–1.10)
GFR calc Af Amer: >90 mL/min (ref >90)
Potassium: 4.5 mEq/L (ref 3.5–5.1)
Total Protein: 7.2 g/dL (ref 6.0–8.3)

## 2012-04-30 LAB — CBC WITH DIFFERENTIAL/PLATELET
Basophils Relative: 0 % (ref 0–1)
Eosinophils Absolute: 0 10*3/uL (ref 0.0–0.7)
Eosinophils Relative: 0 % (ref 0–5)
Hemoglobin: 15.2 g/dL — ABNORMAL HIGH (ref 12.0–15.0)
MCH: 31.8 pg (ref 26.0–34.0)
MCHC: 34.9 g/dL (ref 30.0–36.0)
MCV: 91 fL (ref 78.0–100.0)
Monocytes Relative: 10 % (ref 3–12)
Neutrophils Relative %: 52 % (ref 43–77)

## 2012-04-30 LAB — LIPASE, BLOOD: Lipase: 47 U/L (ref 11–59)

## 2012-04-30 MED ORDER — IOHEXOL 300 MG/ML  SOLN: 100.0000 mL | Freq: Once | INTRAMUSCULAR | Status: DC | PRN

## 2012-04-30 MED ORDER — FENTANYL CITRATE 0.05 MG/ML IJ SOLN: 25.0000 ug | Freq: Once | INTRAMUSCULAR | Status: DC

## 2012-04-30 MED ORDER — TRAMADOL HCL 50 MG PO TABS: 50.0000 mg | ORAL_TABLET | Freq: Four times a day (QID) | ORAL | Status: DC | PRN

## 2012-04-30 MED ORDER — IOHEXOL 300 MG/ML  SOLN
50.0000 mL | Freq: Once | INTRAMUSCULAR | Status: AC | PRN
  Administered 2012-04-30: 50 mL via ORAL

## 2012-04-30 MED ORDER — SODIUM CHLORIDE 0.9 % IV SOLN: 1000.0000 mL | Freq: Once | INTRAVENOUS | Status: DC

## 2012-04-30 MED ORDER — KETOROLAC TROMETHAMINE 30 MG/ML IJ SOLN
15.0000 mg | Freq: Once | INTRAMUSCULAR | Status: DC
  Filled 2012-04-30: qty 1

## 2012-04-30 MED ORDER — ONDANSETRON HCL 4 MG/2ML IJ SOLN
4.0000 mg | Freq: Once | INTRAMUSCULAR | Status: DC
  Filled 2012-04-30: qty 2

## 2012-04-30 NOTE — ED Provider Notes (Signed)
History   This chart was scribed for Gerhard Munch, MD by Donne Anon, ED Scribe. This patient was seen in room MH12/MH12 and the patient's care was started at 2335.   CSN: [id number]  Arrival date & time 04/29/12  2240   First MD Initiated Contact with Patient 04/29/12 2335      Chief Complaint  Patient presents with  . Diverticulitis     The history is provided by the patient. No language interpreter was used.   Norma Miller is a 57 y.o. female who presents to the Emergency Department complaining of diverticulitis. Two weeks ago she went to Urgent Care and was diagnosed with diverticulitis, clinically. She reports she improved after this, but the left side abdominal pain began worsening tonight. She reports the abdominal pain began 2+ weeks ago. She reports associated chills and constipation. She denies fever, vomiting, or any other pain. She reports she has high cholesterol but is otherwise a healthy individual. Over the day today, there has been no clear alleviating or exacerbating factor   Past Medical History  Diagnosis Date  . Asthma   . High cholesterol   . Bursitis   . Diverticulitis of colon     Past Surgical History  Procedure Date  . Ovary surgery   . Cholecystectomy     History reviewed. No pertinent family history.  History  Substance Use Topics  . Smoking status: Never Smoker   . Smokeless tobacco: Not on file  . Alcohol Use: No    Review of Systems  Constitutional: Positive for chills.       Per HPI, otherwise negative  HENT: Negative.        Per HPI, otherwise negative  Eyes: Negative.   Respiratory:       Per HPI, otherwise negative  Cardiovascular: Negative.        Per HPI, otherwise negative  Gastrointestinal: Positive for constipation. Negative for nausea, vomiting and diarrhea.  Genitourinary: Negative.   Musculoskeletal: Negative.        Per HPI, otherwise negative  Skin: Negative.   Neurological: Negative for syncope.    Hematological: Negative.   Psychiatric/Behavioral: Negative.     Allergies  Cephalexin; Compazine; Flucytosine; Nitrofurantoin monohyd macro; and Welchol  Home Medications   Current Outpatient Rx  Name  Route  Sig  Dispense  Refill  . ALBUTEROL SULFATE HFA 108 (90 BASE) MCG/ACT IN AERS   Inhalation   Inhale 2 puffs into the lungs every 6 (six) hours as needed.         . BECLOMETHASONE DIPROPIONATE 40 MCG/ACT IN AERS   Inhalation   Inhale 2 puffs into the lungs 2 (two) times daily.         Marland Kitchen HYDROCHLOROTHIAZIDE 25 MG PO TABS   Oral   Take 25 mg by mouth daily. Patient stated that she took this medication today as well.         . IBUPROFEN 800 MG PO TABS   Oral   Take 800 mg by mouth every 8 (eight) hours as needed.         Marland Kitchen LAMOTRIGINE 25 MG PO TABS   Oral   Take 25 mg by mouth daily.         Marland Kitchen LANSOPRAZOLE 15 MG PO CPDR   Oral   Take 15 mg by mouth daily.         Marland Kitchen POTASSIUM CHLORIDE ER 10 MEQ PO TBCR   Oral   Take 2 tablets (20 mEq  total) by mouth 2 (two) times daily.   10 tablet   0   . PREDNISONE 20 MG PO TABS   Oral   Take 3 tablets (60 mg total) by mouth daily.   15 tablet   0   . SERTRALINE HCL 25 MG PO TABS   Oral   Take 25 mg by mouth daily.         . SUCRALFATE 1 G PO TABS   Oral   Take 1 g by mouth 4 (four) times daily.           Triage vitals; BP 135/91  Pulse 102  Temp 97.9 F (36.6 C) (Oral)  Resp 16  Ht 5' (1.524 m)  Wt 180 lb (81.647 kg)  BMI 35.15 kg/m2  SpO2 100%  Physical Exam  Nursing note and vitals reviewed. Constitutional: She is oriented to person, place, and time. She appears well-developed and well-nourished. No distress.  HENT:  Head: Normocephalic and atraumatic.  Eyes: Conjunctivae normal and EOM are normal.  Cardiovascular: Normal rate and regular rhythm.   Pulmonary/Chest: Effort normal and breath sounds normal. No stridor. No respiratory distress.  Abdominal: She exhibits no distension. There  is tenderness in the left upper quadrant and left lower quadrant.  Musculoskeletal: She exhibits no edema.  Neurological: She is alert and oriented to person, place, and time. No cranial nerve deficit.  Skin: Skin is warm and dry.  Psychiatric: She has a normal mood and affect.    ED Course  Procedures (including critical care time) DIAGNOSTIC STUDIES: Oxygen Saturation is 100% on room air, normal by my interpretation.    COORDINATION OF CARE: 12:25 AM Discussed treatment plan which includes CT scan with pt at bedside and pt agreed to plan.     Labs Reviewed  URINALYSIS, ROUTINE W REFLEX MICROSCOPIC - Abnormal; Notable for the following:    Hgb urine dipstick SMALL (*)     Leukocytes, UA SMALL (*)     All other components within normal limits  URINE MICROSCOPIC-ADD ON - Abnormal; Notable for the following:    Squamous Epithelial / LPF FEW (*)     Bacteria, UA MANY (*)     All other components within normal limits  URINE CULTURE   No results found.   No diagnosis found.  Update, the patient refuses pain medication.  Update: I from the patient of all results, the need for continued outpatient evaluation of her abdominal pain.  Discussed possible etiologies, including scar tissue, resolving infection, other colonic or GI pathology.  The patient has a gastroenterologist with whom she will followup, via telephone tomorrow.  MDM   I personally performed the services described in this documentation, which was scribed in my presence. The recorded information has been reviewed and is accurate.  This patient presents with concerns of ongoing left sided abdominal pain.  On exam she is in no distress, with mild tachycardia, but otherwise unremarkable vital signs.  The patient does have tenderness to palpation, and given her endorsement of recent clinical diagnosis of diverticulitis, with this resumption of abdominal pain there is some suspicion for acute intra-abdominal process, such as  abscess or perforation.  The patient's labs, CT were all reassuring.  Within the absence of distress, and stable vital signs, the patient was discharged to follow up with her gastroenterologist, tomorrow.   Gerhard Munch, MD 04/30/12 [phone]

## 2012-04-30 NOTE — ED Notes (Signed)
MD at bedside. 

## 2012-05-01 LAB — URINE CULTURE
Colony Count: NO GROWTH
Culture: NO GROWTH

## 2014-08-22 ENCOUNTER — Encounter (HOSPITAL_BASED_OUTPATIENT_CLINIC_OR_DEPARTMENT_OTHER): Payer: Self-pay

## 2014-08-22 ENCOUNTER — Emergency Department (HOSPITAL_BASED_OUTPATIENT_CLINIC_OR_DEPARTMENT_OTHER)
Admission: EM | Admit: 2014-08-22 | Discharge: 2014-08-22 | Disposition: A | Discharge status: Home / Self Care | Payer: Medicare Other | Attending: Emergency Medicine | Admitting: Emergency Medicine

## 2014-08-22 DIAGNOSIS — N898 Other specified noninflammatory disorders of vagina: Secondary | ICD-10-CM | POA: Diagnosis present

## 2014-08-22 DIAGNOSIS — B373 Candidiasis of vulva and vagina: Secondary | ICD-10-CM | POA: Insufficient documentation

## 2014-08-22 DIAGNOSIS — Z7951 Long term (current) use of inhaled steroids: Secondary | ICD-10-CM | POA: Insufficient documentation

## 2014-08-22 DIAGNOSIS — J45909 Unspecified asthma, uncomplicated: Secondary | ICD-10-CM | POA: Diagnosis not present

## 2014-08-22 DIAGNOSIS — N390 Urinary tract infection, site not specified: Secondary | ICD-10-CM | POA: Insufficient documentation

## 2014-08-22 DIAGNOSIS — M329 Systemic lupus erythematosus, unspecified: Secondary | ICD-10-CM | POA: Diagnosis not present

## 2014-08-22 DIAGNOSIS — Z79899 Other long term (current) drug therapy: Secondary | ICD-10-CM | POA: Insufficient documentation

## 2014-08-22 DIAGNOSIS — Z8639 Personal history of other endocrine, nutritional and metabolic disease: Secondary | ICD-10-CM | POA: Diagnosis not present

## 2014-08-22 DIAGNOSIS — Z9889 Other specified postprocedural states: Secondary | ICD-10-CM | POA: Diagnosis not present

## 2014-08-22 DIAGNOSIS — Z8719 Personal history of other diseases of the digestive system: Secondary | ICD-10-CM | POA: Diagnosis not present

## 2014-08-22 DIAGNOSIS — B379 Candidiasis, unspecified: Secondary | ICD-10-CM

## 2014-08-22 HISTORY — DX: Systemic lupus erythematosus, unspecified: M32.9

## 2014-08-22 HISTORY — DX: Reserved for concepts with insufficient information to code with codable children: IMO0002

## 2014-08-22 LAB — WET PREP, GENITAL
CLUE CELLS WET PREP: NONE SEEN
Trich, Wet Prep: NONE SEEN
Yeast Wet Prep HPF POC: NONE SEEN

## 2014-08-22 LAB — URINE MICROSCOPIC-ADD ON

## 2014-08-22 LAB — URINALYSIS, ROUTINE W REFLEX MICROSCOPIC
Bilirubin Urine: NEGATIVE
Glucose, UA: NEGATIVE mg/dL
Hgb urine dipstick: NEGATIVE
Ketones, ur: NEGATIVE mg/dL
Nitrite: NEGATIVE
Protein, ur: NEGATIVE mg/dL
Specific Gravity, Urine: 1.024 (ref 1.005–1.030)
Urobilinogen, UA: 0.2 mg/dL (ref 0.0–1.0)
pH: 5.5 (ref 5.0–8.0)

## 2014-08-22 MED ORDER — CIPROFLOXACIN HCL 500 MG PO TABS: 500.0000 mg | ORAL_TABLET | Freq: Two times a day (BID) | ORAL | Status: DC

## 2014-08-22 MED ORDER — ONDANSETRON 8 MG PO TBDP
8.0000 mg | ORAL_TABLET | Freq: Once | ORAL | Status: AC
  Administered 2014-08-22: 8 mg via ORAL
  Filled 2014-08-22: qty 1

## 2014-08-22 MED ORDER — ONDANSETRON HCL 4 MG PO TABS: 4.0000 mg | ORAL_TABLET | Freq: Four times a day (QID) | ORAL | Status: DC

## 2014-08-22 MED ORDER — FLUCONAZOLE 200 MG PO TABS: 200.0000 mg | ORAL_TABLET | Freq: Every day | ORAL | Status: DC

## 2014-08-22 NOTE — ED Notes (Signed)
Pelvic cart set up 

## 2014-08-22 NOTE — ED Provider Notes (Signed)
CSN: [id number]     Arrival date & time 08/22/14  1956 History   First MD Initiated Contact with Patient 08/22/14 2014     Chief Complaint  Patient presents with  . Vaginal Discharge     (Consider location/radiation/quality/duration/timing/severity/associated sxs/prior Treatment) HPI  Norma Miller is a 59 y.o. female presenting with presenting with 2-3 days of dysuria, frequency, urgency. Patient reported vaginal discharge with foul odor one week ago that is since resolved after taking Monistat. She denies abdominal pain. She does report subjective fevers with nausea but no vomiting. No back pain. She states this is similar to last time she had a urinary tract infection. Last BM yesterday and normal without blood.    Past Medical History  Diagnosis Date  . Asthma   . High cholesterol   . Bursitis   . Diverticulitis of colon   . Lupus    Past Surgical History  Procedure Laterality Date  . Ovary surgery    . Cholecystectomy    . Foot surgery     No family history on file. History  Substance Use Topics  . Smoking status: Never Smoker   . Smokeless tobacco: Not on file  . Alcohol Use: No   OB History    No data available     Review of Systems 10 Systems reviewed and are negative for acute change except as noted in the HPI.    Allergies  Azithromycin; Cephalexin; Compazine; Flucytosine; Nitrofurantoin monohyd macro; and Welchol  Home Medications   Prior to Admission medications   Medication Sig Start Date End Date Taking? Authorizing Provider  levalbuterol [facility] HFA) 45 MCG/ACT inhaler Inhale into the lungs every 4 (four) hours as needed for wheezing.   Yes Historical Provider, MD  levalbuterol (XOPENEX) 0.31 MG/3ML nebulizer solution Take 1 ampule by nebulization every 4 (four) hours as needed for wheezing.   Yes Historical Provider, MD  sulfaSALAzine (AZULFIDINE) 500 MG tablet Take 500 mg by mouth 4 (four) times daily.   Yes Historical Provider, MD  albuterol  (PROVENTIL HFA;VENTOLIN HFA) 108 (90 BASE) MCG/ACT inhaler Inhale 2 puffs into the lungs every 6 (six) hours as needed.    Historical Provider, MD  beclomethasone (QVAR) 40 MCG/ACT inhaler Inhale 2 puffs into the lungs 2 (two) times daily.    Historical Provider, MD  ciprofloxacin (CIPRO) 500 MG tablet Take 1 tablet (500 mg total) by mouth 2 (two) times daily. 08/22/14   Oswaldo Conroy, PA-C  fluconazole (DIFLUCAN) 200 MG tablet Take 1 tablet (200 mg total) by mouth daily. 08/22/14   Oswaldo Conroy, PA-C  hydrochlorothiazide (HYDRODIURIL) 25 MG tablet Take 25 mg by mouth daily. Patient stated that she took this medication today as well.    Historical Provider, MD  ibuprofen (ADVIL,MOTRIN) 800 MG tablet Take 800 mg by mouth every 8 (eight) hours as needed.    Historical Provider, MD  lamoTRIgine (LAMICTAL) 25 MG tablet Take 25 mg by mouth daily.    Historical Provider, MD  lansoprazole (PREVACID) 15 MG capsule Take 15 mg by mouth daily.    Historical Provider, MD  ondansetron (ZOFRAN) 4 MG tablet Take 1 tablet (4 mg total) by mouth every 6 (six) hours. 08/22/14   Oswaldo Conroy, PA-C  potassium chloride (K-DUR) 10 MEQ tablet Take 2 tablets (20 mEq total) by mouth 2 (two) times daily. 07/31/11 07/30/12  John Molpus, MD  predniSONE (DELTASONE) 20 MG tablet Take 3 tablets (60 mg total) by mouth daily. 02/04/12   Susy Frizzle, MD  sertraline (ZOLOFT) 25 MG tablet Take 25 mg by mouth daily.    Historical Provider, MD  sucralfate (CARAFATE) 1 G tablet Take 1 g by mouth 4 (four) times daily.    Historical Provider, MD  traMADol (ULTRAM) 50 MG tablet Take 1 tablet (50 mg total) by mouth every 6 (six) hours as needed for pain. 04/30/12   Gerhard Munchobert Lockwood, MD   BP 132/79 mmHg  Pulse 67  Temp(Src) 98 F (36.7 C) (Oral)  Resp 18  Ht 5' (1.524 m)  Wt 168 lb (76.204 kg)  BMI 32.81 kg/m2  SpO2 100% Physical Exam  Constitutional: She appears well-developed and well-nourished. No distress.  HENT:  Head:  Normocephalic and atraumatic.  Mouth/Throat: Oropharynx is clear and moist.  Eyes: Conjunctivae and EOM are normal. Right eye exhibits no discharge. Left eye exhibits no discharge.  Cardiovascular: Normal rate and regular rhythm.   Pulmonary/Chest: Effort normal and breath sounds normal. No respiratory distress. She has no wheezes.  Abdominal: Soft. Bowel sounds are normal. She exhibits no distension. There is no tenderness.  Genitourinary:  External genitalia without erythema, tenderness, lesions. Cervix pink without lesions. Os closed. No CMT. Mild right and left adnexal tenderness. No adnexal masses appreciated. Minimal discharge without foul odor. Nurse in room for exam.  Neurological: She is alert. She exhibits normal muscle tone. Coordination normal.  Skin: Skin is warm and dry. She is not diaphoretic.  Nursing note and vitals reviewed.   ED Course  Procedures (including critical care time) Labs Review Labs Reviewed  WET PREP, GENITAL - Abnormal; Notable for the following:    WBC, Wet Prep HPF POC FEW (*)    All other components within normal limits  URINALYSIS, ROUTINE W REFLEX MICROSCOPIC - Abnormal; Notable for the following:    APPearance CLOUDY (*)    Leukocytes, UA MODERATE (*)    All other components within normal limits  URINE MICROSCOPIC-ADD ON - Abnormal; Notable for the following:    Squamous Epithelial / LPF MANY (*)    Bacteria, UA MANY (*)    All other components within normal limits  GC/CHLAMYDIA PROBE AMP ([place])    Imaging Review No results found.   EKG Interpretation None      Meds given in ED:  Medications  ondansetron (ZOFRAN-ODT) disintegrating tablet 8 mg (8 mg Oral Given 08/22/14 2053)    New Prescriptions   CIPROFLOXACIN (CIPRO) 500 MG TABLET    Take 1 tablet (500 mg total) by mouth 2 (two) times daily.   FLUCONAZOLE (DIFLUCAN) 200 MG TABLET    Take 1 tablet (200 mg total) by mouth daily.   ONDANSETRON (ZOFRAN) 4 MG TABLET    Take 1  tablet (4 mg total) by mouth every 6 (six) hours.      MDM   Final diagnoses:  UTI (lower urinary tract infection)  Yeast infection   Pt presenting with urinary symptoms and vaginal discharge. Pt reports some nausea without emesis and no abdominal or back pain. No fever in ED. VSS. Abdomen non tender. Pelvic exam with minimal discomfort. Wet prep with few WBC. I doubt PID or cervicitis. UA with moderate leukocytes and many bacteria with many squamous cells. Pt symptomatic with treat with cipro. Culture pending. Pt also with yeast infection. Treat with fluconazole. Pt tolerating fluid in ED. Pt nontoxic nonseptic appearing and stable for discharge. Pt to follow up with PCP.  Discussed return precautions with patient. Discussed all results and patient verbalizes understanding and agrees with plan.  Oswaldo ConroyVictoria Quint Chestnut, PA-C 08/22/14 2253  Raeford RazorStephen Kohut, MD 08/22/14 [phone]

## 2014-08-22 NOTE — ED Notes (Signed)
Pt reports subjective fever,Has had nausea.  Reports has had vaginal discharge and itching.  Took monistat but no improvement.

## 2014-08-22 NOTE — Discharge Instructions (Signed)
Return to the emergency room with worsening of symptoms, new symptoms or with symptoms that are concerning, especially fevers, nausea, vomiting, unable to keep fluids down, abdominal pain, back pain. Please call your doctor for a followup appointment within 24-48 hours. When you talk to your doctor please let them know that you were seen in the emergency department and have them acquire all of your records so that they can discuss the findings with you and formulate a treatment plan to fully care for your new and ongoing problems. Please take all of your antibiotics until finished!   You may develop abdominal discomfort or diarrhea from the antibiotic.  You may help offset this with probiotics which you can buy or get in yogurt. Do not eat  or take the probiotics until 2 hours after your antibiotic.  Fluconazole for yeast infection. Read below information and follow recommendations. Urinary Tract Infection Urinary tract infections (UTIs) can develop anywhere along your urinary tract. Your urinary tract is your body's drainage system for removing wastes and extra water. Your urinary tract includes two kidneys, two ureters, a bladder, and a urethra. Your kidneys are a pair of bean-shaped organs. Each kidney is about the size of your fist. They are located below your ribs, one on each side of your spine. CAUSES Infections are caused by microbes, which are microscopic organisms, including fungi, viruses, and bacteria. These organisms are so small that they can only be seen through a microscope. Bacteria are the microbes that most commonly cause UTIs. SYMPTOMS  Symptoms of UTIs may vary by age and gender of the patient and by the location of the infection. Symptoms in young women typically include a frequent and intense urge to urinate and a painful, burning feeling in the bladder or urethra during urination. Older women and men are more likely to be tired, shaky, and weak and have muscle aches and abdominal  pain. A fever may mean the infection is in your kidneys. Other symptoms of a kidney infection include pain in your back or sides below the ribs, nausea, and vomiting. DIAGNOSIS To diagnose a UTI, your caregiver will ask you about your symptoms. Your caregiver also will ask to provide a urine sample. The urine sample will be tested for bacteria and white blood cells. White blood cells are made by your body to help fight infection. TREATMENT  Typically, UTIs can be treated with medication. Because most UTIs are caused by a bacterial infection, they usually can be treated with the use of antibiotics. The choice of antibiotic and length of treatment depend on your symptoms and the type of bacteria causing your infection. HOME CARE INSTRUCTIONS  If you were prescribed antibiotics, take them exactly as your caregiver instructs you. Finish the medication even if you feel better after you have only taken some of the medication.  Drink enough water and fluids to keep your urine clear or pale yellow.  Avoid caffeine, tea, and carbonated beverages. They tend to irritate your bladder.  Empty your bladder often. Avoid holding urine for long periods of time.  Empty your bladder before and after sexual intercourse.  After a bowel movement, women should cleanse from front to back. Use each tissue only once. SEEK MEDICAL CARE IF:   You have back pain.  You develop a fever.  Your symptoms do not begin to resolve within 3 days. SEEK IMMEDIATE MEDICAL CARE IF:   You have severe back pain or lower abdominal pain.  You develop chills.  You have  nausea or vomiting.  You have continued burning or discomfort with urination. MAKE SURE YOU:   Understand these instructions.  Will watch your condition.  Will get help right away if you are not doing well or get worse. Document Released: 01/11/2005 Document Revised: 10/03/2011 Document Reviewed: 05/12/2011 [facility] Patient Information 2015 [place]. This information is not intended to replace advice given to you by your health care provider. Make sure you discuss any questions you have with your health care provider.

## 2014-08-23 LAB — URINE CULTURE: Colony Count: 50000

## 2014-08-24 LAB — GC/CHLAMYDIA PROBE AMP (~~LOC~~) NOT AT ARMC
CHLAMYDIA, DNA PROBE: NEGATIVE
Neisseria Gonorrhea: NEGATIVE

## 2015-05-04 ENCOUNTER — Ambulatory Visit (INDEPENDENT_AMBULATORY_CARE_PROVIDER_SITE_OTHER): Payer: Medicare Other | Admitting: Pediatrics

## 2015-05-04 ENCOUNTER — Encounter: Payer: Self-pay | Admitting: Pediatrics

## 2015-05-04 VITALS — BP 126/84 | Ht 60.0 in | Wt 179.5 lb

## 2015-05-04 DIAGNOSIS — H1045 Other chronic allergic conjunctivitis: Secondary | ICD-10-CM | POA: Diagnosis not present

## 2015-05-04 DIAGNOSIS — M329 Systemic lupus erythematosus, unspecified: Secondary | ICD-10-CM

## 2015-05-04 DIAGNOSIS — J4530 Mild persistent asthma, uncomplicated: Secondary | ICD-10-CM | POA: Diagnosis not present

## 2015-05-04 DIAGNOSIS — H1010 Acute atopic conjunctivitis, unspecified eye: Secondary | ICD-10-CM

## 2015-05-04 DIAGNOSIS — K219 Gastro-esophageal reflux disease without esophagitis: Secondary | ICD-10-CM

## 2015-05-04 DIAGNOSIS — IMO0002 Reserved for concepts with insufficient information to code with codable children: Secondary | ICD-10-CM

## 2015-05-04 LAB — PULMONARY FUNCTION TEST

## 2015-05-04 MED ORDER — BECLOMETHASONE DIPROPIONATE 80 MCG/ACT IN AERS: 1.0000 | INHALATION_SPRAY | Freq: Two times a day (BID) | RESPIRATORY_TRACT | Status: AC

## 2015-05-04 MED ORDER — LEVALBUTEROL HCL 1.25 MG/3ML IN NEBU: 1.2500 mg | INHALATION_SOLUTION | Freq: Four times a day (QID) | RESPIRATORY_TRACT | Status: AC | PRN

## 2015-05-04 NOTE — Progress Notes (Signed)
  [address] [place] [address] Dept: [phone]  FOLLOW UP NOTE  Patient ID: Norma Miller, female    DOB: Oct 27, 1955  Age: 60 y.o. MRN: [id number] Date of Office Visit: 05/04/2015  Assessment Chief Complaint: Asthma  HPI [name] presents for follow-up of her asthma. She has been coughing for 4 days. She could not tolerate albuterol 0.083% because of tachycardia and we prescribed Xopenex 1.25 mg every 6 hours in a  nebulizer and she tolerated it well.. She ran out of Qvar. She has been having a itchy eyes. Her lupus is under control  Current medications Xopenex 1.25 mg every 6 hours if needed. Her other medications are outlined in the chart   Drug Allergies:  Allergies  Allergen Reactions  . Welchol [Colesevelam Hcl] Nausea And Vomiting  . Azithromycin   . Cephalexin   . Compazine   . Flucytosine   . Nitrofurantoin     Other reaction(s): RASH Other reaction(s): RASH  . Nitrofurantoin Monohyd Macro     Physical Exam: BP 126/84 mmHg  Ht 5' (1.524 m)  Wt 179 lb 7.3 oz (81.4 kg)  BMI 35.05 kg/m2   Physical Exam  Constitutional: She is oriented to person, place, and time. She appears well-developed and well-nourished.  HENT:  Eyes normal. Ears normal. Nose normal. Pharynx normal.  Neck: Neck supple.  Cardiovascular:  S1 and S2 normal no murmurs  Pulmonary/Chest:  Clear to percussion auscultation  Musculoskeletal: Normal range of motion.  Lymphadenopathy:    She has no cervical adenopathy.  Neurological: She is alert and oriented to person, place, and time.  Psychiatric: She has a normal mood and affect. Her behavior is normal. Judgment and thought content normal.  Vitals reviewed.   Diagnostics:   FVC 2.27 L FEV1 1.79 L. Predicted FVC 2.26 L predicted FEV1 1.85 L-spirometry is in the normal range  Assessment and Plan: 1. Mild persistent asthma, uncomplicated   2. Seasonal allergic conjunctivitis   3. Gastroesophageal reflux disease  without esophagitis   4. Lupus (HCC)     Meds ordered this encounter  Medications  . beclomethasone (QVAR) 80 MCG/ACT inhaler    Sig: Inhale 1 puff into the lungs 2 (two) times daily.    Dispense:  1 Inhaler    Refill:  5  . levalbuterol (XOPENEX) 1.25 MG/3ML nebulizer solution    Sig: Take 1.25 mg by nebulization every 6 (six) hours as needed for wheezing.    Dispense:  72 mL    Refill:  1    Patient Instructions  Qvar 80-1 puff twice a day to prevent coughing or wheezing. You may then decrease Qvar 80 to 1 puff once a day if you're cough or wheeze free Xopenex 2 puffs every 6 hours if needed for coughing or wheezing or instead Xopenex 1.25 mg one unit dose every 6 hours if needed Zyrtec 10 mg once a day if needed for runny nose or itchy eyes Opcon-A one drop 3 times a day if needed for itchy eyes    Return in about 6 weeks (around 06/15/2015).    Thank you for the opportunity to care for this patient.  Please do not hesitate to contact me with questions.  Tonette Bihari, M.D.  [facility] [address] [place], [place] [address] [phone]

## 2015-05-04 NOTE — Patient Instructions (Addendum)
Qvar 80-1 puff twice a day to prevent coughing or wheezing. You may then decrease Qvar 80 to 1 puff once a day if you're cough or wheeze free Xopenex 2 puffs every 6 hours if needed for coughing or wheezing or instead Xopenex 1.25 mg one unit dose every 6 hours if needed Zyrtec 10 mg once a day if needed for runny nose or itchy eyes Opcon-A one drop 3 times a day if needed for itchy eyes

## 2015-05-05 ENCOUNTER — Encounter: Payer: Self-pay | Admitting: Pediatrics

## 2015-06-17 ENCOUNTER — Ambulatory Visit: Payer: Medicare Other | Admitting: Pediatrics

## 2015-10-20 ENCOUNTER — Emergency Department (HOSPITAL_BASED_OUTPATIENT_CLINIC_OR_DEPARTMENT_OTHER)
Admission: EM | Admit: 2015-10-20 | Discharge: 2015-10-20 | Disposition: A | Discharge status: Home / Self Care | Payer: Medicare Other | Attending: Emergency Medicine | Admitting: Emergency Medicine

## 2015-10-20 ENCOUNTER — Emergency Department (HOSPITAL_BASED_OUTPATIENT_CLINIC_OR_DEPARTMENT_OTHER): Payer: Medicare Other

## 2015-10-20 ENCOUNTER — Encounter (HOSPITAL_BASED_OUTPATIENT_CLINIC_OR_DEPARTMENT_OTHER): Payer: Self-pay | Admitting: *Deleted

## 2015-10-20 DIAGNOSIS — Z79899 Other long term (current) drug therapy: Secondary | ICD-10-CM | POA: Diagnosis not present

## 2015-10-20 DIAGNOSIS — R202 Paresthesia of skin: Secondary | ICD-10-CM | POA: Diagnosis not present

## 2015-10-20 DIAGNOSIS — Z7951 Long term (current) use of inhaled steroids: Secondary | ICD-10-CM | POA: Insufficient documentation

## 2015-10-20 DIAGNOSIS — E7800 Pure hypercholesterolemia, unspecified: Secondary | ICD-10-CM | POA: Diagnosis not present

## 2015-10-20 DIAGNOSIS — R42 Dizziness and giddiness: Secondary | ICD-10-CM

## 2015-10-20 DIAGNOSIS — R110 Nausea: Secondary | ICD-10-CM | POA: Diagnosis not present

## 2015-10-20 DIAGNOSIS — J45909 Unspecified asthma, uncomplicated: Secondary | ICD-10-CM | POA: Diagnosis not present

## 2015-10-20 LAB — CBC WITH DIFFERENTIAL/PLATELET
BASOS ABS: 0 10*3/uL (ref 0.0–0.1)
Basophils Relative: 0 %
Eosinophils Absolute: 0.1 10*3/uL (ref 0.0–0.7)
Eosinophils Relative: 1 %
HEMATOCRIT: 42.7 % (ref 36.0–46.0)
Hemoglobin: 15.1 g/dL — ABNORMAL HIGH (ref 12.0–15.0)
LYMPHS ABS: 3.3 10*3/uL (ref 0.7–4.0)
LYMPHS PCT: 40 %
MCH: 31.7 pg (ref 26.0–34.0)
MCHC: 35.4 g/dL (ref 30.0–36.0)
MCV: 89.5 fL (ref 78.0–100.0)
MONO ABS: 0.8 10*3/uL (ref 0.1–1.0)
MONOS PCT: 9 %
Neutro Abs: 4 10*3/uL (ref 1.7–7.7)
Neutrophils Relative %: 50 %
Platelets: 199 10*3/uL (ref 150–400)
RBC: 4.77 MIL/uL (ref 3.87–5.11)
RDW: 14.6 % (ref 11.5–15.5)
WBC: 8.1 10*3/uL (ref 4.0–10.5)

## 2015-10-20 LAB — BASIC METABOLIC PANEL
Anion gap: 9 (ref 5–15)
BUN: 14 mg/dL (ref 6–20)
CO2: 25 mmol/L (ref 22–32)
Calcium: 9.1 mg/dL (ref 8.9–10.3)
Chloride: 105 mmol/L (ref 101–111)
Creatinine, Ser: 0.83 mg/dL (ref 0.44–1.00)
GFR calc non Af Amer: >60 mL/min (ref >60)
Glucose, Bld: 97 mg/dL (ref 65–99)
Potassium: 3.9 mmol/L (ref 3.5–5.1)
Sodium: 139 mmol/L (ref 135–145)

## 2015-10-20 LAB — URINE MICROSCOPIC-ADD ON

## 2015-10-20 LAB — URINALYSIS, ROUTINE W REFLEX MICROSCOPIC
BILIRUBIN URINE: NEGATIVE
Glucose, UA: NEGATIVE mg/dL
Ketones, ur: NEGATIVE mg/dL
NITRITE: NEGATIVE
PH: 6.5 (ref 5.0–8.0)
Protein, ur: NEGATIVE mg/dL
Specific Gravity, Urine: 1.016 (ref 1.005–1.030)

## 2015-10-20 LAB — TROPONIN I: Troponin I: <0.03 ng/mL (ref <0.03)

## 2015-10-20 MED ORDER — ONDANSETRON HCL 4 MG/2ML IJ SOLN
4.0000 mg | Freq: Once | INTRAMUSCULAR | Status: AC
  Administered 2015-10-20: 4 mg via INTRAVENOUS
  Filled 2015-10-20: qty 2

## 2015-10-20 MED ORDER — SODIUM CHLORIDE 0.9 % IV BOLUS (SEPSIS)
1000.0000 mL | Freq: Once | INTRAVENOUS | Status: AC
  Administered 2015-10-20: 1000 mL via INTRAVENOUS

## 2015-10-20 MED ORDER — MECLIZINE HCL 25 MG PO TABS
25.0000 mg | ORAL_TABLET | Freq: Once | ORAL | Status: DC
  Filled 2015-10-20: qty 1

## 2015-10-20 MED ORDER — ONDANSETRON 4 MG PO TBDP: 4.0000 mg | ORAL_TABLET | Freq: Three times a day (TID) | ORAL | Status: DC | PRN

## 2015-10-20 NOTE — Discharge Instructions (Signed)
Take your home meclizine to help with your symptoms. Stay well hydrated. Use zofran as directed as needed for nausea. Follow up with your neurologist in 3-5 days for recheck of symptoms and ongoing management of your lightheadedness. Return to the ER for changes or worsening symptoms.   Benign Positional Vertigo Vertigo is the feeling that you or your surroundings are moving when they are not. Benign positional vertigo is the most common form of vertigo. The cause of this condition is not serious (is benign). This condition is triggered by certain movements and positions (is positional). This condition can be dangerous if it occurs while you are doing something that could endanger you or others, such as driving.  CAUSES In many cases, the cause of this condition is not known. It may be caused by a disturbance in an area of the inner ear that helps your brain to sense movement and balance. This disturbance can be caused by a viral infection (labyrinthitis), head injury, or repetitive motion. RISK FACTORS This condition is more likely to develop in:  Women.  People who are 52 years of age or older. SYMPTOMS Symptoms of this condition usually happen when you move your head or your eyes in different directions. Symptoms may start suddenly, and they usually last for less than a minute. Symptoms may include:  Loss of balance and falling.  Feeling like you are spinning or moving.  Feeling like your surroundings are spinning or moving.  Nausea and vomiting.  Blurred vision.  Dizziness.  Involuntary eye movement (nystagmus). Symptoms can be mild and cause only slight annoyance, or they can be severe and interfere with daily life. Episodes of benign positional vertigo may return (recur) over time, and they may be triggered by certain movements. Symptoms may improve over time. DIAGNOSIS This condition is usually diagnosed by medical history and a physical exam of the head, neck, and ears. You may  be referred to a health care provider who specializes in ear, nose, and throat (ENT) problems (otolaryngologist) or a provider who specializes in disorders of the nervous system (neurologist). You may have additional testing, including:  MRI.  A CT scan.  Eye movement tests. Your health care provider may ask you to change positions quickly while he or she watches you for symptoms of benign positional vertigo, such as nystagmus. Eye movement may be tested with an electronystagmogram (ENG), caloric stimulation, the Dix-Hallpike test, or the roll test.  An electroencephalogram (EEG). This records electrical activity in your brain.  Hearing tests. TREATMENT Usually, your health care provider will treat this by moving your head in specific positions to adjust your inner ear back to normal. Surgery may be needed in severe cases, but this is rare. In some cases, benign positional vertigo may resolve on its own in 2-4 weeks. HOME CARE INSTRUCTIONS Safety  Move slowly.Avoid sudden body or head movements.  Avoid driving.  Avoid operating heavy machinery.  Avoid doing any tasks that would be dangerous to you or others if a vertigo episode would occur.  If you have trouble walking or keeping your balance, try using a cane for stability. If you feel dizzy or unstable, sit down right away.  Return to your normal activities as told by your health care provider. Ask your health care provider what activities are safe for you. General Instructions  Take over-the-counter and prescription medicines only as told by your health care provider.  Avoid certain positions or movements as told by your health care provider.  Drink  enough fluid to keep your urine clear or pale yellow.  Keep all follow-up visits as told by your health care provider. This is important. SEEK MEDICAL CARE IF:  You have a fever.  Your condition gets worse or you develop new symptoms.  Your family or friends notice any  behavioral changes.  Your nausea or vomiting gets worse.  You have numbness or a "pins and needles" sensation. SEEK IMMEDIATE MEDICAL CARE IF:  You have difficulty speaking or moving.  You are always dizzy.  You faint.  You develop severe headaches.  You have weakness in your legs or arms.  You have changes in your hearing or vision.  You develop a stiff neck.  You develop sensitivity to light.   This information is not intended to replace advice given to you by your health care provider. Make sure you discuss any questions you have with your health care provider.   Document Released: 01/09/2006 Document Revised: 12/23/2014 Document Reviewed: 07/27/2014 Elsevier Interactive Patient Education 2016 Elsevier Inc.  Dizziness Dizziness is a common problem. It makes you feel unsteady or lightheaded. You may feel like you are about to pass out (faint). Dizziness can lead to injury if you stumble or fall. Anyone can get dizzy, but dizziness is more common in older adults. This condition can be caused by a number of things, including:  Medicines.  Dehydration.  Illness. HOME CARE Following these instructions may help with your condition: Eating and Drinking  Drink enough fluid to keep your pee (urine) clear or pale yellow. This helps to keep you from getting dehydrated. Try to drink more clear fluids, such as water.  Do not drink alcohol.  Limit how much caffeine you drink or eat if told by your doctor.  Limit how much salt you drink or eat if told by your doctor. Activity  Avoid making quick movements.  When you stand up from sitting in a chair, steady yourself until you feel okay.  In the morning, first sit up on the side of the bed. When you feel okay, stand slowly while you hold onto something. Do this until you know that your balance is fine.  Move your legs often if you need to stand in one place for a long time. Tighten and relax your muscles in your legs while  you are standing.  Do not drive or use heavy machinery if you feel dizzy.  Avoid bending down if you feel dizzy. Place items in your home so that they are easy for you to reach without leaning over. Lifestyle  Do not use any tobacco products, including cigarettes, chewing tobacco, or electronic cigarettes. If you need help quitting, ask your doctor.  Try to lower your stress level, such as with yoga or meditation. Talk with your doctor if you need help. General Instructions  Watch your dizziness for any changes.  Take medicines only as told by your doctor. Talk with your doctor if you think that your dizziness is caused by a medicine that you are taking.  Tell a friend or a family member that you are feeling dizzy. If he or she notices any changes in your behavior, have this person call your doctor.  Keep all follow-up visits as told by your doctor. This is important. GET HELP IF:  Your dizziness does not go away.  Your dizziness or light-headedness gets worse.  You feel sick to your stomach (nauseous).  You have trouble hearing.  You have new symptoms.  You are unsteady  on your feet or you feel like the room is spinning. GET HELP RIGHT AWAY IF:  You throw up (vomit) or have diarrhea and are unable to eat or drink anything.  You have trouble:  Talking.  Walking.  Swallowing.  Using your arms, hands, or legs.  You feel generally weak.  You are not thinking clearly or you have trouble forming sentences. It may take a friend or family member to notice this.  You have:  Chest pain.  Pain in your belly (abdomen).  Shortness of breath.  Sweating.  Your vision changes.  You are bleeding.  You have a headache.  You have neck pain or a stiff neck.  You have a fever.   This information is not intended to replace advice given to you by your health care provider. Make sure you discuss any questions you have with your health care provider.   Document  Released: 03/23/2011 Document Revised: 08/18/2014 Document Reviewed: 03/30/2014 Elsevier Interactive Patient Education 2016 Elsevier Inc.  Nausea, Adult Nausea means you feel sick to your stomach or need to throw up (vomit). It may be a sign of a more serious problem. If nausea gets worse, you may throw up. If you throw up a lot, you may lose too much body fluid (dehydration). HOME CARE   Get plenty of rest.  Ask your doctor how to replace body fluid losses (rehydrate).  Eat small amounts of food. Sip liquids more often.  Take all medicines as told by your doctor. GET HELP RIGHT AWAY IF:  You have a fever.  You pass out (faint).  You keep throwing up or have blood in your throw up.  You are very weak, have dry lips or a dry mouth, or you are very thirsty (dehydrated).  You have dark or bloody poop (stool).  You have very bad chest or belly (abdominal) pain.  You do not get better after 2 days, or you get worse.  You have a headache. MAKE SURE YOU:  Understand these instructions.  Will watch your condition.  Will get help right away if you are not doing well or get worse.   This information is not intended to replace advice given to you by your health care provider. Make sure you discuss any questions you have with your health care provider.   Document Released: 03/23/2011 Document Revised: 06/26/2011 Document Reviewed: 03/23/2011 Elsevier Interactive Patient Education 2016 Elsevier Inc.  Paresthesia Paresthesia is a burning or prickling feeling. This feeling can happen in any part of the body. It often happens in the hands, arms, legs, or feet. Usually, it is not painful. In most cases, the feeling goes away in a short time and is not a sign of a serious problem. HOME CARE  Avoid drinking alcohol.  Try massage or needle therapy (acupuncture) to help with your problems.  Keep all follow-up visits as told by your doctor. This is important. GET HELP IF:  You keep  on having episodes of paresthesia.  Your burning or prickling feeling gets worse when you walk.  You have pain or cramps.  You feel dizzy.  You have a rash. GET HELP RIGHT AWAY IF:  You feel weak.  You have trouble walking or moving.  You have problems speaking, understanding, or seeing.  You feel confused.  You cannot control when you pee (urinate) or poop (bowel movement).  You lose feeling (numbness) after an injury.  You pass out (faint).   This information is not intended to  replace advice given to you by your health care provider. Make sure you discuss any questions you have with your health care provider.   Document Released: 03/16/2008 Document Revised: 08/18/2014 Document Reviewed: 03/30/2014 Elsevier Interactive Patient Education [employer] Elsevier Inc.

## 2015-10-20 NOTE — ED Notes (Signed)
Pt here from PCP for dizziness that feels like "Im not stable".  This has been intermittent for a week. No focal weakness. No face drift, no arm drift, no slurred speech.  Pt has a "tingly feeling at times in left face".  Pt was sent here by PCP for CT scan.

## 2015-10-20 NOTE — ED Provider Notes (Signed)
CSN: [id number]     Arrival date & time 10/20/15  1727 History  By signing my name below, I, Tanda Rockers, attest that this documentation has been prepared under the direction and in the presence of [address] [employer]. Electronically Signed: Tanda Rockers, ED Scribe. 10/20/2015. 6:03 PM.   Chief Complaint  Patient presents with  . Dizziness   Patient is a 60 y.o. female presenting with dizziness. The history is provided by the patient and medical records. No language interpreter was used.  Dizziness Quality:  Lightheadedness Severity:  Moderate Onset quality:  Gradual Duration:  6 days Timing:  Intermittent Progression:  Unchanged Chronicity:  Recurrent Context: standing up   Relieved by:  Being still Worsened by:  Movement Ineffective treatments:  None tried Associated symptoms: headaches (intermittent, currently resolved) and nausea   Associated symptoms: no blood in stool, no chest pain, no diarrhea, no shortness of breath, no syncope, no vision changes, no vomiting and no weakness   Risk factors: hx of vertigo   Risk factors: no new medications     HPI Comments: Norma Miller is a 60 y.o. female with a PMHx of Lupus, Asthma, and HLD, who presents to the Emergency Department complaining of gradual onset, intermittent lightheadedness x 6 days. The symptoms present with changes in position (like sitting or standing) and are relieved with staying at rest. She also complains of associated nausea, an intermittent tingling sensation to the left side of her body/face/head, and left sided weakness that is only present when she has the lightheaded feeling although she is not having any weakness today/currently. She mentions that prior to the tingling sensation beginning she had a mild headache that turning into a tingling, prickly sensation to her left forehead that radiates down her entire left side. Pt took Excedrin for the headache and Pepto bismol for the nausea with mild relief. Pt  mentions having similar symptoms in the past with vertigo but denies room spinning dizziness at this time-- states she didn't try her meclizine because she wasn't sure if she should. Previously had PT for BPPV in the past. She called her PCP but reports that he was out on vacation. Pt heard back from the office today and was told to go to Urgent Care. She was seen at [facility] but was unable to have CT done at that location and came here to the ED for further evaluation and a CT Head. No recent changes in medications. Denies double vision, blurry vision, vision loss, fever, chills, neck stiffness, chest pain, shortness of breath, abdominal pain, vomiting, diarrhea, constipation, melena, hematochezia, difficulty urinating, dysuria, hematuria, numbness, weakness, slurred speech, aphasia, word-finding difficulties, ongoing HA, syncope, or any other associated symptoms.   As an aside, she wants [place] to check her U/A because she had a UTI several weeks ago and wants to "check to see if it's gone". Denies symptoms.  Neurologist - Dr. Chriss Czar  Past Medical History  Diagnosis Date  . Asthma   . High cholesterol   . Bursitis   . Diverticulitis of colon   . Lupus [facility])    Past Surgical History  Procedure Laterality Date  . Ovary surgery    . Cholecystectomy    . Foot surgery     No family history on file. Social History  Substance Use Topics  . Smoking status: Never Smoker   . Smokeless tobacco: None  . Alcohol Use: No   OB History    No data available  Review of Systems  Constitutional: Negative for fever and chills.  Eyes: Negative for visual disturbance.  Respiratory: Negative for shortness of breath.   Cardiovascular: Negative for chest pain and syncope.  Gastrointestinal: Positive for nausea. Negative for vomiting, abdominal pain, diarrhea, constipation and blood in stool.  Genitourinary: Negative for dysuria, hematuria and difficulty urinating.  Musculoskeletal: Negative for  myalgias, arthralgias and neck stiffness.  Skin: Negative for color change.  Allergic/Immunologic: Positive for immunocompromised state (lupus).  Neurological: Positive for light-headedness (with position changes) and headaches (intermittent, currently resolved). Negative for dizziness, speech difficulty, weakness and numbness.       + Paresthesias  Psychiatric/Behavioral: Negative for confusion.  A complete 10 system review of systems was obtained and all systems are negative except as noted in the HPI and PMH.    Allergies  Welchol; Azithromycin; Cephalexin; Compazine; Flucytosine; Nitrofurantoin; and Nitrofurantoin monohyd macro  Home Medications   Prior to Admission medications   Medication Sig Start Date End Date Taking? Authorizing Provider  beclomethasone (QVAR) 80 MCG/ACT inhaler Inhale 1 puff into the lungs 2 (two) times daily. 05/04/15  Yes Fletcher Anon, MD  lansoprazole (PREVACID) 15 MG capsule Take 15 mg by mouth daily.   Yes Historical Provider, MD  levalbuterol [facility] HFA) 45 MCG/ACT inhaler Inhale into the lungs every 4 (four) hours as needed for wheezing.   Yes Historical Provider, MD  levalbuterol (XOPENEX) 1.25 MG/3ML nebulizer solution Take 1.25 mg by nebulization every 6 (six) hours as needed for wheezing. 05/04/15  Yes Fletcher Anon, MD  sertraline (ZOLOFT) 25 MG tablet Take 25 mg by mouth daily.   Yes Historical Provider, MD  sulfaSALAzine (AZULFIDINE) 500 MG tablet Take 500 mg by mouth 4 (four) times daily.   Yes Historical Provider, MD  potassium chloride (K-DUR) 10 MEQ tablet Take 2 tablets (20 mEq total) by mouth 2 (two) times daily. 07/31/11 07/30/12  John Molpus, MD   BP 131/81 mmHg  Pulse 75  Temp(Src) 98.7 F (37.1 C) (Oral)  Resp 18  Wt 180 lb (81.647 kg)  SpO2 99%   Physical Exam  Constitutional: She is oriented to person, place, and time. Vital signs are normal. She appears well-developed and well-nourished.  Non-toxic appearance. No distress.   Afebrile, nontoxic, NAD, reports lightheaded feeling when sitting up and standing  HENT:  Head: Normocephalic and atraumatic.  Mouth/Throat: Oropharynx is clear and moist and mucous membranes are normal.  No facial droop. No abnormal tongue deviation.   Eyes: Conjunctivae and EOM are normal. Pupils are equal, round, and reactive to light. Right eye exhibits no discharge. Left eye exhibits no discharge.  PERRL, EOMI, no nystagmus, no visual field deficits   Neck: Normal range of motion. Neck supple. No spinous process tenderness and no muscular tenderness present. No rigidity. Normal range of motion present.  FROM intact without spinous process TTP, no bony stepoffs or deformities, no paraspinous muscle TTP or muscle spasms. No rigidity or meningeal signs. No bruising or swelling.   Cardiovascular: Normal rate, regular rhythm, normal heart sounds and intact distal pulses.  Exam reveals no gallop and no friction rub.   No murmur heard. Pulmonary/Chest: Effort normal and breath sounds normal. No respiratory distress. She has no decreased breath sounds. She has no wheezes. She has no rhonchi. She has no rales.  Abdominal: Soft. Normal appearance and bowel sounds are normal. She exhibits no distension. There is no tenderness. There is no rigidity, no rebound, no guarding, no CVA tenderness, no tenderness at  McBurney's point and negative Murphy's sign.  Musculoskeletal: Normal range of motion.  MAE x4 Strength and sensation grossly intact Distal pulses intact Gait steady  Neurological: She is alert and oriented to person, place, and time. She has normal strength. No cranial nerve deficit or sensory deficit. Coordination and gait normal. GCS eye subscore is 4. GCS verbal subscore is 5. GCS motor subscore is 6.  CN 2-12 grossly intact A&O x4 GCS 15 Sensation and strength intact, although patient reports "different" sensation on the left face Gait nonataxic including with tandem walking Coordination  with finger-to-nose WNL Neg pronator drift   Skin: Skin is warm, dry and intact. No rash noted.  Psychiatric: She has a normal mood and affect.  Nursing note and vitals reviewed.   ED Course  Procedures (including critical care time)  18:37 Orthostatic Vital Signs FV  Orthostatic Lying  - BP- Lying: 124/73 mmHg ; Pulse- Lying: 61  Orthostatic Sitting - BP- Sitting: 138/85 mmHg ; Pulse- Sitting: 64  Orthostatic Standing at 0 minutes - BP- Standing at 0 minutes: 142/88 mmHg ; Pulse- Standing at 0 minutes: 63       DIAGNOSTIC STUDIES: Oxygen Saturation is 99% on RA, normal by my interpretation.    COORDINATION OF CARE: 6:01 PM-Discussed treatment plan which includes CT Head with pt at bedside and pt agreed to plan.   Labs Review Labs Reviewed  CBC WITH DIFFERENTIAL/PLATELET - Abnormal; Notable for the following:    Hemoglobin 15.1 (*)    All other components within normal limits  URINALYSIS, ROUTINE W REFLEX MICROSCOPIC (NOT [facility]) - Abnormal; Notable for the following:    Hgb urine dipstick LARGE (*)    Leukocytes, UA SMALL (*)    All other components within normal limits  URINE MICROSCOPIC-ADD ON - Abnormal; Notable for the following:    Squamous Epithelial / LPF 0-5 (*)    Bacteria, UA RARE (*)    All other components within normal limits  TROPONIN I  BASIC METABOLIC PANEL    Imaging Review Ct Head Wo Contrast  10/20/2015  CLINICAL DATA:  Dizziness and headaches since Thursday. Left facial tingling. EXAM: CT HEAD WITHOUT CONTRAST TECHNIQUE: Contiguous axial images were obtained from the base of the skull through the vertex without intravenous contrast. COMPARISON:  MRI 07/07/2014.  Head CT 08/18/2009. FINDINGS: There is no evidence for acute hemorrhage, hydrocephalus, mass lesion, or abnormal extra-axial fluid collection. No definite CT evidence for acute infarction. Diffuse loss of parenchymal volume is consistent with atrophy. The visualized paranasal sinuses and mastoid  air cells are clear. IMPRESSION: 1. Stable.  No acute intracranial abnormality. 2. Mild atrophy. Electronically Signed   By: Kennith CenterEric  Mansell M.D.   On: 10/20/2015 18:39   I have personally reviewed and evaluated these images and lab results as part of my medical decision-making.   EKG Interpretation   Date/Time:  Wednesday October 20 2015 17:43:25 EDT Ventricular Rate:  71 PR Interval:    QRS Duration: 82 QT Interval:  360 QTC Calculation: 392 R Axis:   27 Text Interpretation:  Sinus rhythm Confirmed by HAVILAND MD, JULIE ([id number])  on 10/20/2015 5:45:12 PM      MDM   Final diagnoses:  Intermittent lightheadedness  Paresthesias  Nausea    60 y.o. female here with lightheadedness x6 days intermittently, has hx of vertigo but states this is different because she also has associated tingling to L face and body. States weakness when she has the episodes but denies weakness now.  On exam, strength and sensation grossly intact but pt states it feels "different" on the L side of her face, no focal neuro deficits; states she feels lightheaded when she sits or stands during exam. Gait steady without ataxia. Discussed that head CT may not be as useful given duration of symptoms, but that it could still potentially show a subacute event; will proceed with labs, EKG, orthostatics, and head CT. Will give fluids and meclizine and zofran. Pt hasn't been eating much lately so this could still be related to inadequate intake vs BPPV. Will reassess shortly.   7:22 PM Pt feeling well, nausea resolved. Didn't take meclizine because she's driving, states she'll take it when she gets home. Orthostatics neg. EKG unremarkable, trop neg, labs all WNL, U/A without ongoing evidence of UTI. CT head neg. Discussed that this could be from BPPV vs ?migraine vs ?TIA. Doubt need for MRI today especially with her benign work up and intermittent symptoms that are mostly resolved today. F/up with neurology in 3-5 days for further  w/up. Zofran Rx given. Home meclizine use encouraged. Stay hydrated. I explained the diagnosis and have given explicit precautions to return to the ER including for any other new or worsening symptoms. The patient understands and accepts the medical plan as it's been dictated and I have answered their questions. Discharge instructions concerning home care and prescriptions have been given. The patient is STABLE and is discharged to home in good condition.  I personally performed the services described in this documentation, which was scribed in my presence. The recorded information has been reviewed and is accurate.   BP 131/81 mmHg  Pulse 75  Temp(Src) 98.7 F (37.1 C) (Oral)  Resp 18  Wt 81.647 kg  SpO2 99%  Meds ordered this encounter  Medications  . sodium chloride 0.9 % bolus 1,000 mL    Sig:   . meclizine (ANTIVERT) tablet 25 mg    Sig:   . ondansetron (ZOFRAN) injection 4 mg    Sig:   . ondansetron (ZOFRAN ODT) 4 MG disintegrating tablet    Sig: Take 1 tablet (4 mg total) by mouth every 8 (eight) hours as needed for nausea or vomiting.    Dispense:  15 tablet    Refill:  0    Order Specific Question:  Supervising Provider    Answer:  Eber HongMILLER, BRIAN [[id number]]        Norma Osgood Camprubi-Soms, PA-C 10/20/15 1925  Jacalyn LefevreJulie Haviland, MD 10/21/15 [phone]

## 2015-10-20 NOTE — ED Notes (Signed)
Patient transported to CT 

## 2018-02-04 ENCOUNTER — Other Ambulatory Visit: Payer: Self-pay

## 2018-02-04 ENCOUNTER — Encounter (HOSPITAL_BASED_OUTPATIENT_CLINIC_OR_DEPARTMENT_OTHER): Payer: Self-pay | Admitting: *Deleted

## 2018-02-04 ENCOUNTER — Emergency Department (HOSPITAL_BASED_OUTPATIENT_CLINIC_OR_DEPARTMENT_OTHER): Payer: Medicare Other

## 2018-02-04 ENCOUNTER — Emergency Department (HOSPITAL_BASED_OUTPATIENT_CLINIC_OR_DEPARTMENT_OTHER)
Admission: EM | Admit: 2018-02-04 | Discharge: 2018-02-04 | Disposition: A | Discharge status: Home / Self Care | Payer: Medicare Other | Attending: Emergency Medicine | Admitting: Emergency Medicine

## 2018-02-04 DIAGNOSIS — J45909 Unspecified asthma, uncomplicated: Secondary | ICD-10-CM | POA: Diagnosis not present

## 2018-02-04 DIAGNOSIS — R002 Palpitations: Secondary | ICD-10-CM | POA: Diagnosis not present

## 2018-02-04 DIAGNOSIS — R079 Chest pain, unspecified: Secondary | ICD-10-CM | POA: Diagnosis not present

## 2018-02-04 DIAGNOSIS — Z79899 Other long term (current) drug therapy: Secondary | ICD-10-CM | POA: Insufficient documentation

## 2018-02-04 DIAGNOSIS — I2510 Atherosclerotic heart disease of native coronary artery without angina pectoris: Secondary | ICD-10-CM | POA: Diagnosis not present

## 2018-02-04 HISTORY — DX: Atherosclerotic heart disease of native coronary artery without angina pectoris: I25.10

## 2018-02-04 LAB — BASIC METABOLIC PANEL
ANION GAP: 9 (ref 5–15)
BUN: 8 mg/dL (ref 8–23)
CALCIUM: 9.6 mg/dL (ref 8.9–10.3)
CO2: 25 mmol/L (ref 22–32)
Chloride: 104 mmol/L (ref 98–111)
Creatinine, Ser: 1.09 mg/dL — ABNORMAL HIGH (ref 0.44–1.00)
GFR calc Af Amer: >60 mL/min (ref >60)
GFR calc non Af Amer: 54 mL/min — ABNORMAL LOW (ref >60)
Glucose, Bld: 119 mg/dL — ABNORMAL HIGH (ref 70–99)
Potassium: 3.9 mmol/L (ref 3.5–5.1)
SODIUM: 138 mmol/L (ref 135–145)

## 2018-02-04 LAB — CBC
HCT: 47.1 % — ABNORMAL HIGH (ref 36.0–46.0)
Hemoglobin: 15 g/dL (ref 12.0–15.0)
MCH: 30.2 pg (ref 26.0–34.0)
MCHC: 31.8 g/dL (ref 30.0–36.0)
MCV: 94.8 fL (ref 80.0–100.0)
Platelets: 231 10*3/uL (ref 150–400)
RBC: 4.97 MIL/uL (ref 3.87–5.11)
RDW: 14.2 % (ref 11.5–15.5)
WBC: 7.2 10*3/uL (ref 4.0–10.5)
nRBC: 0 % (ref 0.0–0.2)

## 2018-02-04 LAB — TROPONIN I: Troponin I: <0.03 ng/mL (ref <0.03)

## 2018-02-04 LAB — D-DIMER, QUANTITATIVE: D-Dimer, Quant: 0.4 ug/mL-FEU (ref 0.00–0.50)

## 2018-02-04 MED ORDER — CARVEDILOL 3.125 MG PO TABS: 6.2500 mg | ORAL_TABLET | Freq: Two times a day (BID) | ORAL | 0 refills | Status: AC

## 2018-02-04 NOTE — ED Triage Notes (Signed)
Chest pain x 2 weeks. hx of MI in August. She stopped taking her Metoprolol due to side effects prior to the pain. Her MD cancelled her appointment last week and wanted to reschedule for November. She felt she needed to be seen today.

## 2018-02-04 NOTE — ED Notes (Signed)
Called [facility] for [facility] cardiologist

## 2018-02-04 NOTE — ED Provider Notes (Signed)
[facility] EMERGENCY DEPARTMENT Provider Note   CSN: [id number] Arrival date & time: 02/04/18  1331     History   Chief Complaint Chief Complaint  Patient presents with  . Chest Pain    HPI Norma Miller is a 62 y.o. female.  HPI   Hx of MI in August Stopped taking metoprolol because it made her psychotic, but once she stopped the metoprolol began to have heaviness in the chest and PVCs again Had Cardiology appt this Wednesday but was canceled by MD  Stopped the metoprolol 10/4, then after that started to have these symptoms Heaviness in chest is constant, pain in upper left shoulder/back and arm coming and going If lay down, symptoms improve but doesn't go away, is worse with exertion, ambulation Taking zofran for nausea, when eat food get palpitations too.  Nausea comes and goes, had it last night but not today.  With exertion will have some dyspnea.  No recent leg pain or swelling.  Not on any blood thinners.  No hx of DVT, PE  Past Medical History:  Diagnosis Date  . Asthma   . Bursitis   . Coronary artery disease   . Diverticulitis of colon   . High cholesterol   . Lupus [facility])     Patient Active Problem List   Diagnosis Date Noted  . Mild persistent asthma 05/04/2015  . Seasonal allergic conjunctivitis 05/04/2015  . Gastroesophageal reflux disease without esophagitis 05/04/2015  . Lupus (HCC) 05/04/2015    Past Surgical History:  Procedure Laterality Date  . CHOLECYSTECTOMY    . FOOT SURGERY    . OVARY SURGERY       OB History   None      Home Medications    Prior to Admission medications   Medication Sig Start Date End Date Taking? Authorizing Provider  sertraline (ZOLOFT) 25 MG tablet Take 25 mg by mouth daily.   Yes [provider]  beclomethasone (QVAR) 80 MCG/ACT inhaler Inhale 1 puff into the lungs 2 (two) times daily. 05/04/15   Fletcher Anon, MD  carvedilol (COREG) 3.125 MG tablet Take 2 tablets (6.25 mg total)  by mouth 2 (two) times daily with a meal. 02/04/18 03/06/18  Alvira Monday, MD  lansoprazole (PREVACID) 15 MG capsule Take 15 mg by mouth daily.    [provider]  levalbuterol Pauline Aus HFA) 45 MCG/ACT inhaler Inhale into the lungs every 4 (four) hours as needed for wheezing.    [provider]  levalbuterol Pauline Aus) 1.25 MG/3ML nebulizer solution Take 1.25 mg by nebulization every 6 (six) hours as needed for wheezing. 05/04/15   Fletcher Anon, MD  ondansetron (ZOFRAN ODT) 4 MG disintegrating tablet Take 1 tablet (4 mg total) by mouth every 8 (eight) hours as needed for nausea or vomiting. 10/20/15   Street, Bill[place], PA-C  potassium chloride (K-DUR) 10 MEQ tablet Take 2 tablets (20 mEq total) by mouth 2 (two) times daily. 07/31/11 07/30/12  Molpus, John, MD  sulfaSALAzine (AZULFIDINE) 500 MG tablet Take 500 mg by mouth 4 (four) times daily.    [provider]    Family History No family history on file.  Social History Social History   Tobacco Use  . Smoking status: Never Smoker  . Smokeless tobacco: Never Used  Substance Use Topics  . Alcohol use: No  . Drug use: No     Allergies   Welchol [colesevelam hcl]; Azithromycin; Cephalexin; Compazine; Flucytosine; Nitrofurantoin; and Nitrofurantoin monohyd macro   Review  of Systems Review of Systems  Constitutional: Negative for fever.  HENT: Negative for sore throat.   Eyes: Negative for visual disturbance.  Respiratory: Negative for cough and shortness of breath.   Cardiovascular: Positive for chest pain.  Gastrointestinal: Positive for nausea. Negative for abdominal pain, diarrhea and vomiting.  Genitourinary: Negative for difficulty urinating.  Musculoskeletal: Negative for back pain and neck pain.  Skin: Negative for rash.  Neurological: Negative for syncope and headaches.     Physical Exam Updated Vital Signs BP 133/78 (BP Location: Right Arm)   Pulse 74   Temp 98.7 F (37.1 C) (Oral)    Resp 20   Ht 5' (1.524 m)   Wt 86.2 kg   SpO2 96%   BMI 37.11 kg/m   Physical Exam  Constitutional: She is oriented to person, place, and time. She appears well-developed and well-nourished. No distress.  HENT:  Head: Normocephalic and atraumatic.  Eyes: Conjunctivae and EOM are normal.  Neck: Normal range of motion.  Cardiovascular: Normal rate, regular rhythm, normal heart sounds and intact distal pulses. Exam reveals no gallop and no friction rub.  No murmur heard. Pulmonary/Chest: Effort normal and breath sounds normal. No respiratory distress. She has no wheezes. She has no rales.  Abdominal: Soft. She exhibits no distension. There is no tenderness. There is no guarding.  Musculoskeletal: She exhibits no edema or tenderness.  Neurological: She is alert and oriented to person, place, and time.  Skin: Skin is warm and dry. No rash noted. She is not diaphoretic. No erythema.  Nursing note and vitals reviewed.    ED Treatments / Results  Labs (all labs ordered are listed, but only abnormal results are displayed) Labs Reviewed  BASIC METABOLIC PANEL - Abnormal; Notable for the following components:      Result Value   Glucose, Bld 119 (*)    Creatinine, Ser 1.09 (*)    GFR calc non Af Amer 54 (*)    All other components within normal limits  CBC - Abnormal; Notable for the following components:   HCT 47.1 (*)    All other components within normal limits  TROPONIN I  D-DIMER, QUANTITATIVE (NOT AT [facility])  TROPONIN I    EKG EKG Interpretation  Date/Time:  Monday February 04 2018 13:37:49 EDT Ventricular Rate:  68 PR Interval:  172 QRS Duration: 84 QT Interval:  366 QTC Calculation: 389 R Axis:   43 Text Interpretation:  Normal sinus rhythm Nonspecific T wave abnormality Abnormal ECG Nonspecific TW change lead III, different from most recent ECG but similar to ECG seen in 2013 Confirmed by Alvira Monday ([id number]) on 02/04/2018 3:49:29 PM   Radiology Dg Chest 2  View  Result Date: 02/04/2018 CLINICAL DATA:  Heart attack 2 months ago with chest pain, initial encounter EXAM: CHEST - 2 VIEW COMPARISON:  12/06/2017 FINDINGS: The heart size and mediastinal contours are within normal limits. Both lungs are clear. The visualized skeletal structures are unremarkable. IMPRESSION: No acute abnormality noted. Electronically Signed   By: Alcide Clever M.D.   On: 02/04/2018 14:02    Procedures Procedures (including critical care time)  Medications Ordered in ED Medications - No data to display   Initial Impression / Assessment and Plan / ED Course  I have reviewed the triage vital signs and the nursing notes.  Pertinent labs & imaging results that were available during my care of the patient were reviewed by me and considered in my medical decision making (see chart for details).  61yo female with history of lupus, asthma, hyperlipidemia, NSTEMI in August with mild nonobstructive disease (0-30% on catheterization), possible HOCM, who presents with chest pain since she stopped her metoprolol 10/4.  ECG without acute changes, similar to prior ECG, no sign of pericarditis.  Chest XR without pneumonia, pneumothorax or pulmonary edema.  Troponin negative x2.  DDimer negative, low risk wells.  Discussed with Dr. Rhona Leavens of [facility].  Given recent catheterization with nonobstructive disease and negative troponins, feel she is appropriate for outpatient follow up. Has appointment scheduled 10/25.  Will initiate carvedilol to replace the metoprolol she had self discontinued.  Patient discharged in stable condition with understanding of reasons to return.    Final Clinical Impressions(s) / ED Diagnoses   Final diagnoses:  Nonspecific chest pain  Palpitations    ED Discharge Orders         Ordered    carvedilol (COREG) 3.125 MG tablet  2 times daily with meals     02/04/18 1736           Alvira Monday, MD 02/05/18 0015

## 2018-02-04 NOTE — ED Notes (Signed)
ED Provider at bedside. 

## 2018-02-04 NOTE — ED Notes (Signed)
Pt refusing IV at this time- reports if necessary she will had IV later.

## 2018-04-02 ENCOUNTER — Other Ambulatory Visit: Payer: Self-pay

## 2018-04-02 ENCOUNTER — Encounter (HOSPITAL_BASED_OUTPATIENT_CLINIC_OR_DEPARTMENT_OTHER): Payer: Self-pay | Admitting: Emergency Medicine

## 2018-04-02 ENCOUNTER — Emergency Department (HOSPITAL_BASED_OUTPATIENT_CLINIC_OR_DEPARTMENT_OTHER)
Admission: EM | Admit: 2018-04-02 | Discharge: 2018-04-02 | Disposition: A | Discharge status: Home / Self Care | Payer: Medicare Other | Attending: Emergency Medicine | Admitting: Emergency Medicine

## 2018-04-02 ENCOUNTER — Emergency Department (HOSPITAL_BASED_OUTPATIENT_CLINIC_OR_DEPARTMENT_OTHER): Payer: Medicare Other

## 2018-04-02 DIAGNOSIS — Z79899 Other long term (current) drug therapy: Secondary | ICD-10-CM | POA: Diagnosis not present

## 2018-04-02 DIAGNOSIS — R002 Palpitations: Secondary | ICD-10-CM

## 2018-04-02 DIAGNOSIS — R001 Bradycardia, unspecified: Secondary | ICD-10-CM | POA: Diagnosis not present

## 2018-04-02 DIAGNOSIS — I2510 Atherosclerotic heart disease of native coronary artery without angina pectoris: Secondary | ICD-10-CM | POA: Diagnosis not present

## 2018-04-02 DIAGNOSIS — J45909 Unspecified asthma, uncomplicated: Secondary | ICD-10-CM | POA: Insufficient documentation

## 2018-04-02 LAB — CBC WITH DIFFERENTIAL/PLATELET
Abs Immature Granulocytes: 0.01 10*3/uL (ref 0.00–0.07)
Basophils Absolute: 0 10*3/uL (ref 0.0–0.1)
Basophils Relative: 0 %
EOS PCT: 2 %
Eosinophils Absolute: 0.1 10*3/uL (ref 0.0–0.5)
HEMATOCRIT: 44.1 % (ref 36.0–46.0)
HEMOGLOBIN: 14.3 g/dL (ref 12.0–15.0)
Immature Granulocytes: 0 %
LYMPHS ABS: 3.5 10*3/uL (ref 0.7–4.0)
LYMPHS PCT: 53 %
MCH: 30.5 pg (ref 26.0–34.0)
MCHC: 32.4 g/dL (ref 30.0–36.0)
MCV: 94 fL (ref 80.0–100.0)
MONO ABS: 0.7 10*3/uL (ref 0.1–1.0)
MONOS PCT: 11 %
Neutro Abs: 2.3 10*3/uL (ref 1.7–7.7)
Neutrophils Relative %: 34 %
Platelets: 223 10*3/uL (ref 150–400)
RBC: 4.69 MIL/uL (ref 3.87–5.11)
RDW: 14.5 % (ref 11.5–15.5)
WBC: 6.6 10*3/uL (ref 4.0–10.5)
nRBC: 0 % (ref 0.0–0.2)

## 2018-04-02 LAB — BASIC METABOLIC PANEL
Anion gap: 6 (ref 5–15)
BUN: 13 mg/dL (ref 8–23)
CHLORIDE: 108 mmol/L (ref 98–111)
CO2: 22 mmol/L (ref 22–32)
CREATININE: 0.86 mg/dL (ref 0.44–1.00)
Calcium: 8.7 mg/dL — ABNORMAL LOW (ref 8.9–10.3)
GFR calc Af Amer: >60 mL/min (ref >60)
GFR calc non Af Amer: >60 mL/min (ref >60)
Glucose, Bld: 122 mg/dL — ABNORMAL HIGH (ref 70–99)
POTASSIUM: 3.6 mmol/L (ref 3.5–5.1)
SODIUM: 136 mmol/L (ref 135–145)

## 2018-04-02 LAB — TROPONIN I

## 2018-04-02 NOTE — ED Provider Notes (Signed)
[facility] EMERGENCY DEPARTMENT Provider Note   CSN: [id number] Arrival date & time: 04/02/18  0211     History   Chief Complaint Chief Complaint  Patient presents with  . Palpitations    HPI Norma Miller is a 62 y.o. female.  HPI  This is a 62 year old female with history of coronary artery disease, high cholesterol, lupus who presents with nausea and palpitations.  Patient reports onset of symptoms after taking her atenolol last night sometime after 11 PM.  She states "I think my blood pressure dropped."  She currently is without symptoms.  She denied any chest pain or shortness of breath during this time.  She states that she is currently on 2 medications for her heart rate but normally has a normal blood pressure and is concerned that her blood pressure may have dropped.  She denies any fevers or recent illnesses.  No cough, abdominal pain, vomiting, diarrhea.  I have reviewed her chart.  She had a cardiac catheterization earlier in fall with less than 30% disease.  She is currently in cardiac rehab and reports that she is tolerating this well.  She was placed back on atenolol recently by her cardiologist.  Past Medical History:  Diagnosis Date  . Asthma   . Bursitis   . Coronary artery disease   . Diverticulitis of colon   . High cholesterol   . Lupus [facility](HCC)     Patient Active Problem List   Diagnosis Date Noted  . Mild persistent asthma 05/04/2015  . Seasonal allergic conjunctivitis 05/04/2015  . Gastroesophageal reflux disease without esophagitis 05/04/2015  . Lupus (HCC) 05/04/2015    Past Surgical History:  Procedure Laterality Date  . CHOLECYSTECTOMY    . FOOT SURGERY    . OVARY SURGERY       OB History   No obstetric history on file.      Home Medications    Prior to Admission medications   Medication Sig Start Date End Date Taking? Authorizing Provider  beclomethasone (QVAR) 80 MCG/ACT inhaler Inhale 1 puff into the lungs 2 (two)  times daily. 05/04/15   Fletcher AnonBardelas, Jose A, MD  carvedilol (COREG) 3.125 MG tablet Take 2 tablets (6.25 mg total) by mouth 2 (two) times daily with a meal. 02/04/18 03/06/18  Alvira MondaySchlossman, Erin, MD  lansoprazole (PREVACID) 15 MG capsule Take 15 mg by mouth daily.    [provider]  levalbuterol Pauline Aus(XOPENEX HFA) 45 MCG/ACT inhaler Inhale into the lungs every 4 (four) hours as needed for wheezing.    [provider]  levalbuterol Pauline Aus(XOPENEX) 1.25 MG/3ML nebulizer solution Take 1.25 mg by nebulization every 6 (six) hours as needed for wheezing. 05/04/15   Fletcher AnonBardelas, Jose A, MD  ondansetron (ZOFRAN ODT) 4 MG disintegrating tablet Take 1 tablet (4 mg total) by mouth every 8 (eight) hours as needed for nausea or vomiting. 10/20/15   Street, KinsmanMercedes, PA-C  potassium chloride (K-DUR) 10 MEQ tablet Take 2 tablets (20 mEq total) by mouth 2 (two) times daily. 07/31/11 07/30/12  Molpus, John, MD  sertraline (ZOLOFT) 25 MG tablet Take 25 mg by mouth daily.    [provider]  sulfaSALAzine (AZULFIDINE) 500 MG tablet Take 500 mg by mouth 4 (four) times daily.    [provider]    Family History No family history on file.  Social History Social History   Tobacco Use  . Smoking status: Never Smoker  . Smokeless tobacco: Never Used  Substance Use Topics  . Alcohol  use: No  . Drug use: No     Allergies   Welchol [colesevelam hcl]; Azithromycin; Cephalexin; Compazine; Flucytosine; Nitrofurantoin; and Nitrofurantoin monohyd macro   Review of Systems Review of Systems  Constitutional: Negative for fever.  Respiratory: Negative for cough and shortness of breath.   Cardiovascular: Positive for palpitations. Negative for chest pain.  Gastrointestinal: Positive for nausea. Negative for abdominal pain, diarrhea and vomiting.  Neurological: Positive for dizziness.  All other systems reviewed and are negative.    Physical Exam Updated Vital Signs BP 110/69   Pulse (!) 48    Temp 98.3 F (36.8 C)   Resp 20   Ht 1.524 m (5')   Wt 85.7 kg   SpO2 97%   BMI 36.91 kg/m   Physical Exam Vitals signs and nursing note reviewed.  Constitutional:      Appearance: She is well-developed.  HENT:     Head: Normocephalic and atraumatic.  Eyes:     Pupils: Pupils are equal, round, and reactive to light.  Neck:     Musculoskeletal: Neck supple.  Cardiovascular:     Rate and Rhythm: Regular rhythm. Bradycardia present.     Heart sounds: Normal heart sounds.  Pulmonary:     Effort: Pulmonary effort is normal. No respiratory distress.     Breath sounds: No wheezing.  Abdominal:     General: Bowel sounds are normal.     Palpations: Abdomen is soft.  Musculoskeletal:        General: No swelling or tenderness.  Skin:    General: Skin is warm and dry.  Neurological:     Mental Status: She is alert and oriented to person, place, and time.      ED Treatments / Results  Labs (all labs ordered are listed, but only abnormal results are displayed) Labs Reviewed  BASIC METABOLIC PANEL - Abnormal; Notable for the following components:      Result Value   Glucose, Bld 122 (*)    Calcium 8.7 (*)    All other components within normal limits  CBC WITH DIFFERENTIAL/PLATELET  TROPONIN I    EKG EKG Interpretation  Date/Time:  Tuesday April 02 2018 02:16:25 EST Ventricular Rate:  53 PR Interval:    QRS Duration: 94 QT Interval:  407 QTC Calculation: 383 R Axis:   48 Text Interpretation:  Sinus rhythm Low voltage, precordial leads Abnormal R-wave progression, early transition No significant change since last tracing Confirmed by Ross Marcus ([id number]) on 04/02/2018 2:19:00 AM   Radiology Dg Chest 2 View  Result Date: 04/02/2018 CLINICAL DATA:  Palpitations. EXAM: CHEST - 2 VIEW COMPARISON:  Chest x-ray dated February 04, 2018. FINDINGS: The heart size and mediastinal contours are within normal limits. Both lungs are clear. The visualized skeletal structures  are unremarkable. IMPRESSION: No active cardiopulmonary disease. Electronically Signed   By: Obie Dredge M.D.   On: 04/02/2018 03:10    Procedures Procedures (including critical care time)  Medications Ordered in ED Medications - No data to display   Initial Impression / Assessment and Plan / ED Course  I have reviewed the triage vital signs and the nursing notes.  Pertinent labs & imaging results that were available during my care of the patient were reviewed by me and considered in my medical decision making (see chart for details).     Patient presents with palpitations and nausea after taking her atenolol last night.  She is overall nontoxic-appearing.  Vital signs are largely reassuring.  She  is slightly bradycardic in the 50s.  It appears to be sinus bradycardia on her EKG.  No evidence of arrhythmia or ischemia.  Blood pressure is normal.  Work-up including troponin is negative.  Doubt ACS.  Electrolytes are reassuring.  Suspect her bradycardia is likely dose related with atenolol.  Patient was monitored in the ED for several hours without recurrence of symptoms.  She had maintained a heart rate in the 50s.  Given her symptoms, would have her hold atenolol and discuss medication adjustments with her cardiologist prior to resuming.  Patient stated understanding and was reassured.  After history, exam, and medical workup I feel the patient has been appropriately medically screened and is safe for discharge home. Pertinent diagnoses were discussed with the patient. Patient was given return precautions.   Final Clinical Impressions(s) / ED Diagnoses   Final diagnoses:  Palpitations  Bradycardia    ED Discharge Orders    None       Shon Baton, MD 04/02/18 [phone]

## 2018-04-02 NOTE — ED Notes (Signed)
Pt unhooked from monitor to go to bathroom  

## 2018-04-02 NOTE — ED Triage Notes (Signed)
Pt brought to ED by EMS from home for c/o palpitation, dizziness and nausea that started after she took her Atenolol last night at 2145. Pt states she had a Hx of MI in 11/2017 and she wants to make sure she is ok. Per EMS HR was on the 50's on arrival to her house and then she was 66 on EKG. Pt denies any pain or SOB at this time.  BP 130/74, HR 66, SPO2 97% RA. 324 mg ASA given by EMS pta.

## 2018-04-02 NOTE — Discharge Instructions (Addendum)
You were seen today for palpitations.  You are found to have a low heart rate.  This may be related to your atenolol.  Call your cardiologist for adjustments in this medication.  Given your symptoms, would hold this medication until you follow-up with cardiology.

## 2019-10-24 IMAGING — DX DG CHEST 2V
2 series · 2 of 2 positions shown · non-contrast
Comparison: Chest x-ray dated February 04, 2018.

CLINICAL DATA: Palpitations.

EXAM:
CHEST - 2 VIEW

[chest pa]
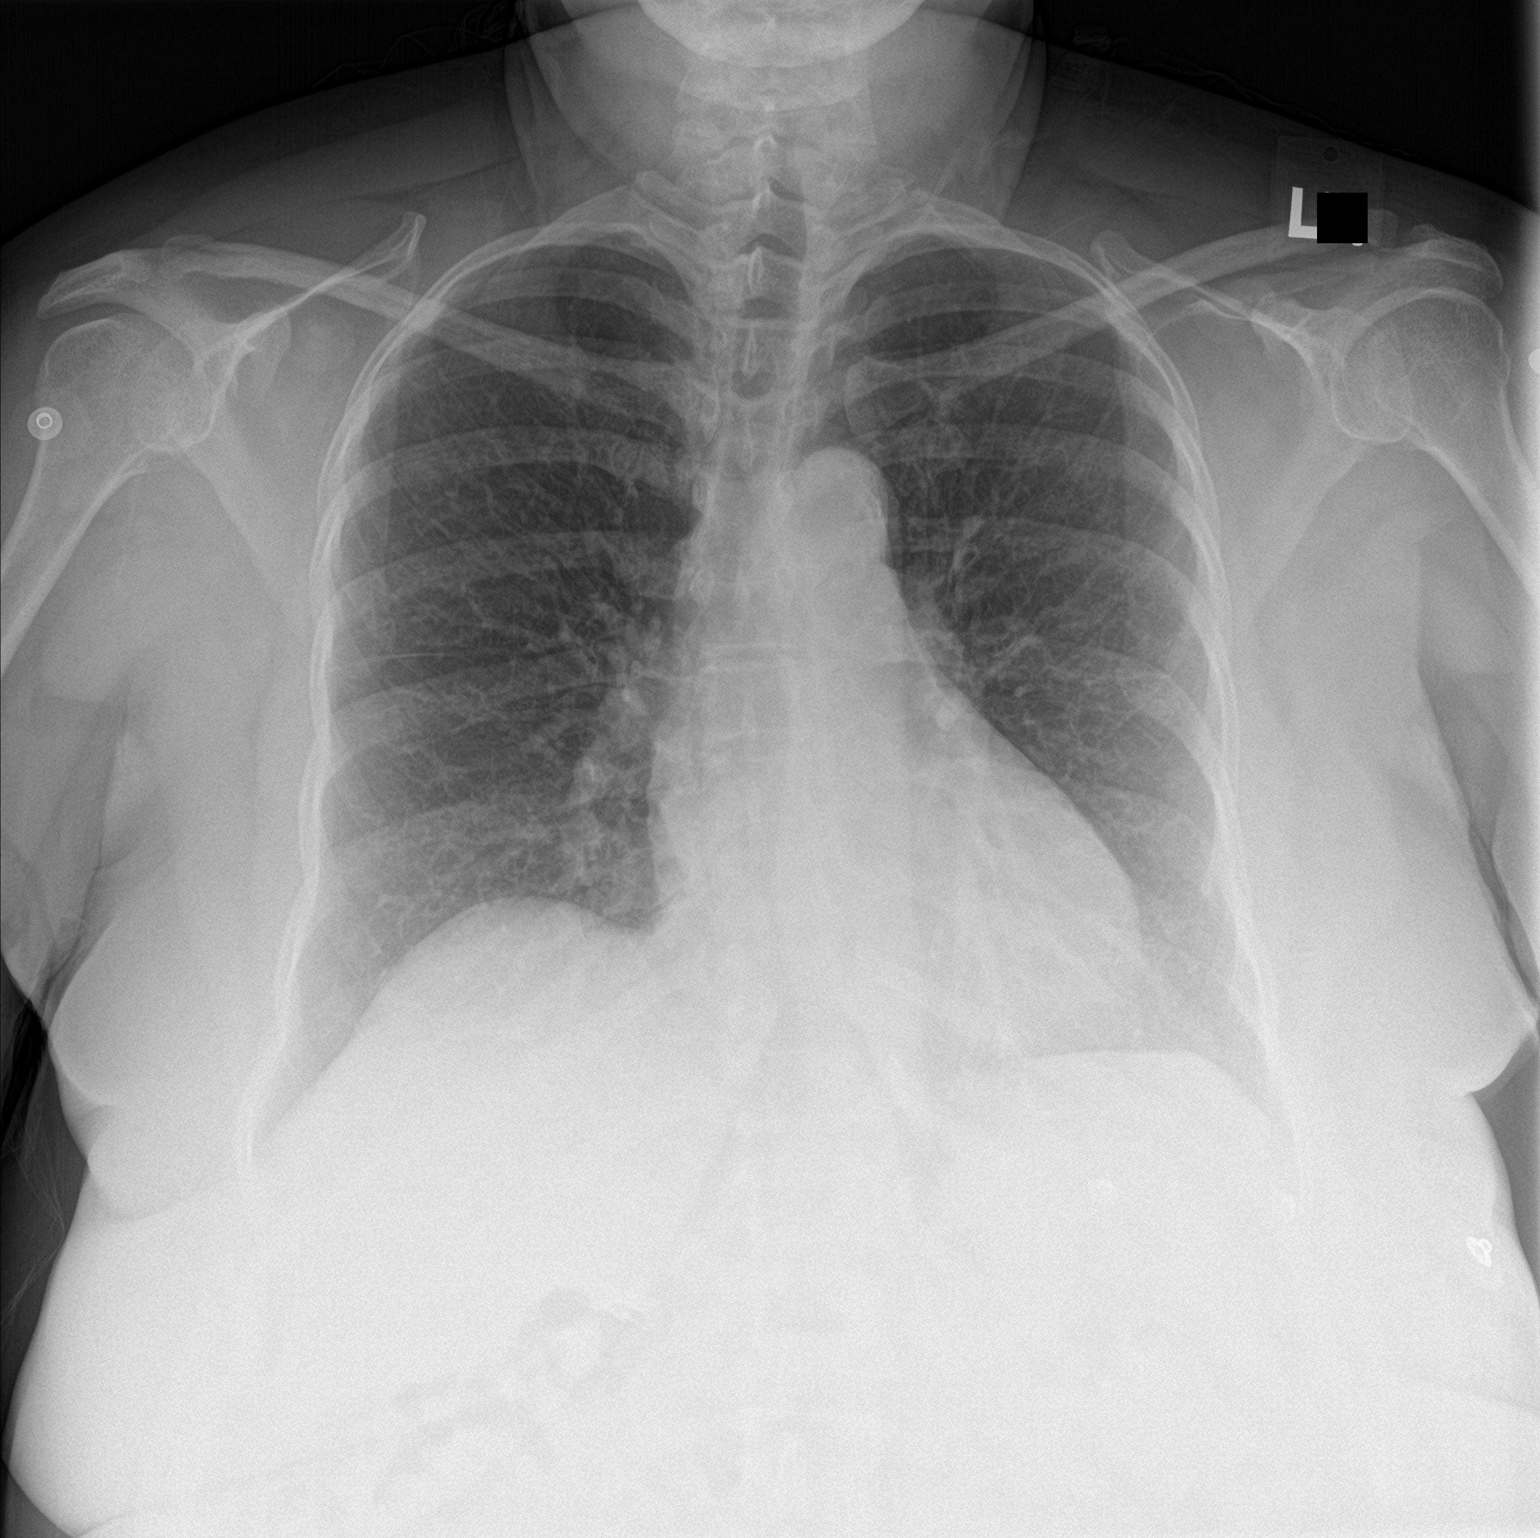

[chest lat]
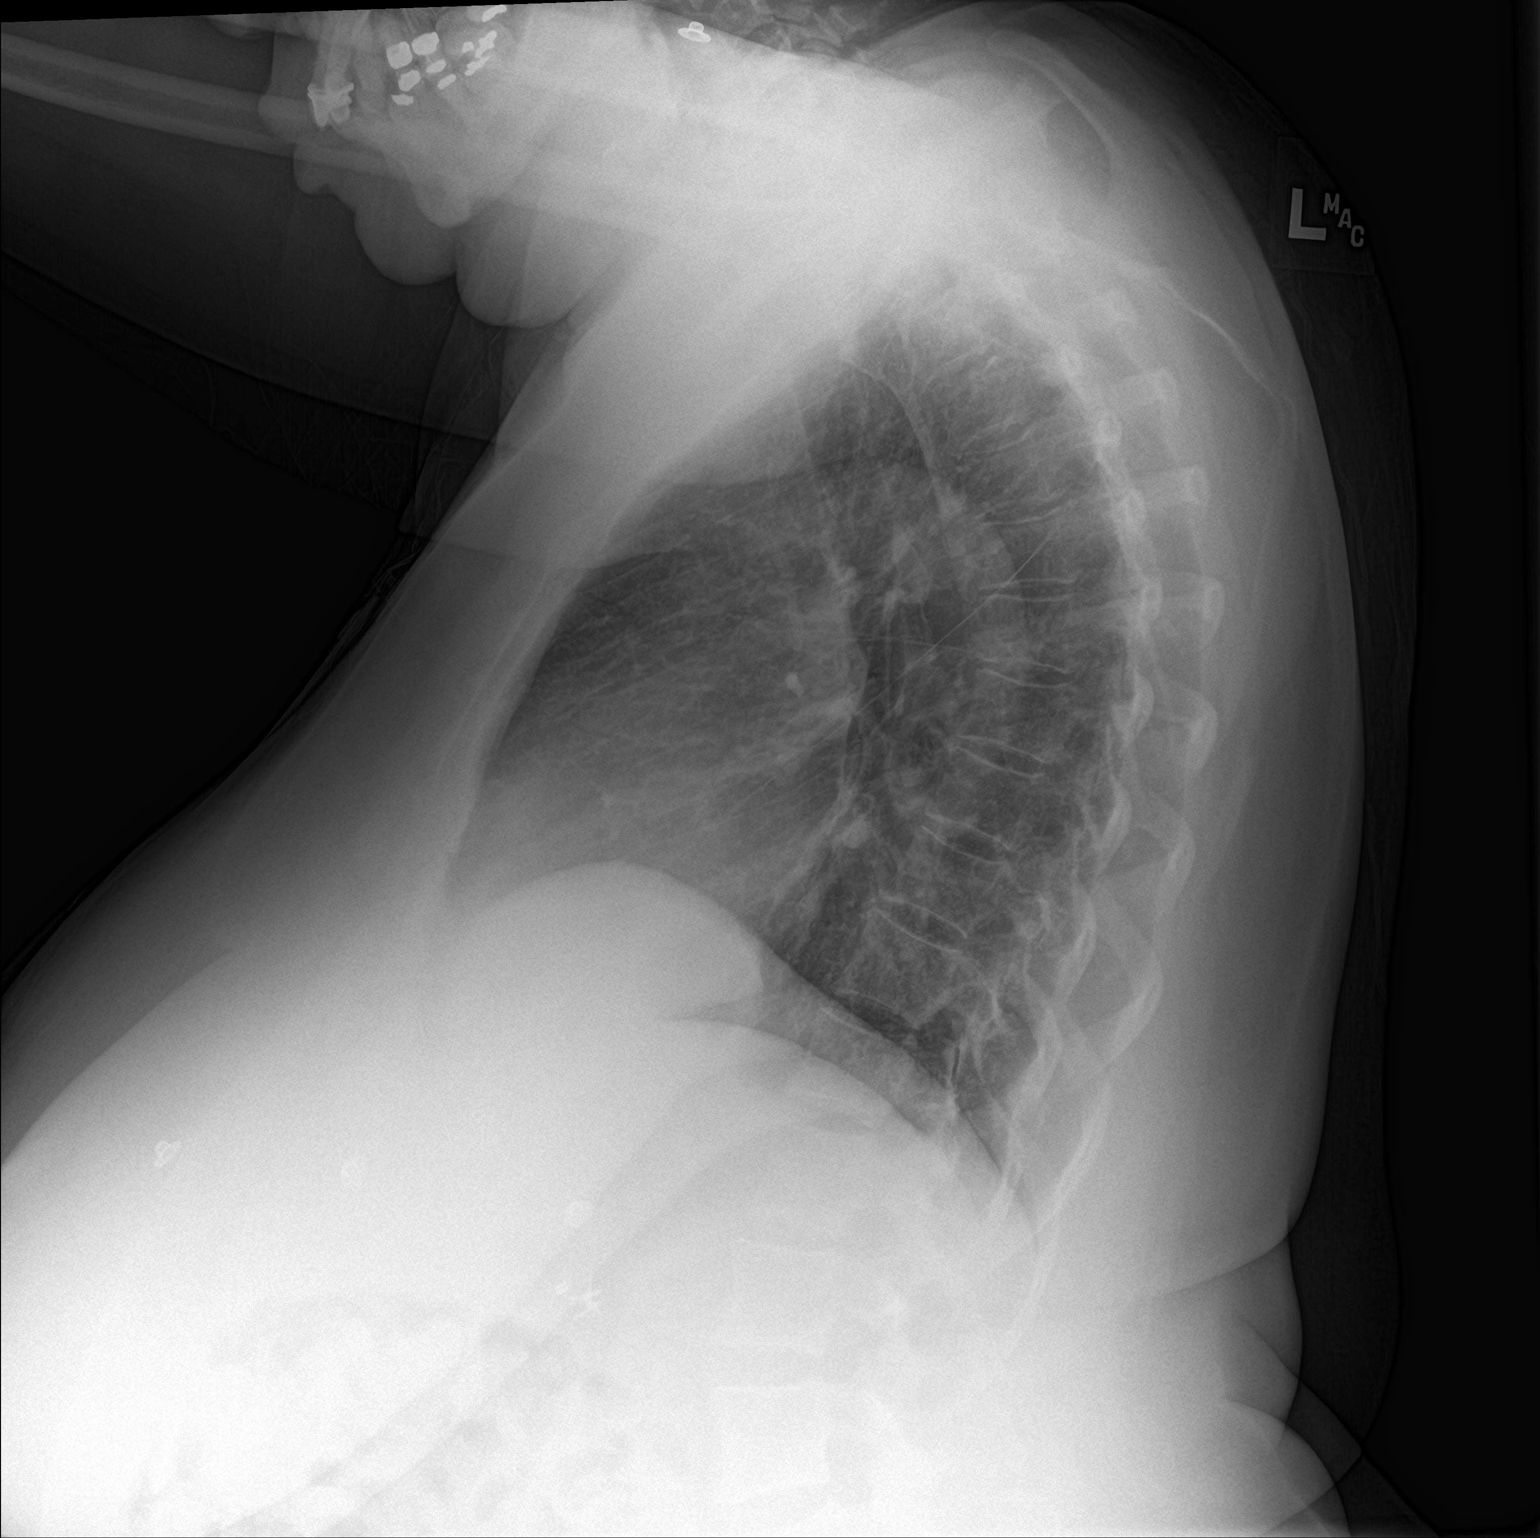

[2 of 2 positions shown; findings below may reference images not displayed]

FINDINGS: The heart size and mediastinal contours are within normal limits.
Both lungs are clear. The visualized skeletal structures are
unremarkable.
IMPRESSION: No active cardiopulmonary disease.

## 2021-07-12 ENCOUNTER — Other Ambulatory Visit: Payer: Self-pay

## 2021-07-12 ENCOUNTER — Encounter (HOSPITAL_BASED_OUTPATIENT_CLINIC_OR_DEPARTMENT_OTHER): Payer: Self-pay

## 2021-07-12 ENCOUNTER — Emergency Department (HOSPITAL_BASED_OUTPATIENT_CLINIC_OR_DEPARTMENT_OTHER): Payer: Medicare Other

## 2021-07-12 ENCOUNTER — Emergency Department (HOSPITAL_BASED_OUTPATIENT_CLINIC_OR_DEPARTMENT_OTHER)
Admission: EM | Admit: 2021-07-12 | Discharge: 2021-07-12 | Disposition: A | Discharge status: Home / Self Care | Payer: Medicare Other | Attending: Emergency Medicine | Admitting: Emergency Medicine

## 2021-07-12 DIAGNOSIS — J45909 Unspecified asthma, uncomplicated: Secondary | ICD-10-CM | POA: Diagnosis not present

## 2021-07-12 DIAGNOSIS — R1032 Left lower quadrant pain: Secondary | ICD-10-CM | POA: Diagnosis present

## 2021-07-12 DIAGNOSIS — I2510 Atherosclerotic heart disease of native coronary artery without angina pectoris: Secondary | ICD-10-CM | POA: Diagnosis not present

## 2021-07-12 DIAGNOSIS — K625 Hemorrhage of anus and rectum: Secondary | ICD-10-CM | POA: Insufficient documentation

## 2021-07-12 HISTORY — DX: Acute myocardial infarction, unspecified: I21.9

## 2021-07-12 LAB — CBC WITH DIFFERENTIAL/PLATELET
Abs Immature Granulocytes: 0.02 10*3/uL (ref 0.00–0.07)
Basophils Absolute: 0 10*3/uL (ref 0.0–0.1)
Basophils Relative: 0 %
Eosinophils Absolute: 0.1 10*3/uL (ref 0.0–0.5)
Eosinophils Relative: 1 %
HCT: 45.5 % (ref 36.0–46.0)
Hemoglobin: 15.5 g/dL — ABNORMAL HIGH (ref 12.0–15.0)
Immature Granulocytes: 0 %
Lymphocytes Relative: 34 %
Lymphs Abs: 2.6 10*3/uL (ref 0.7–4.0)
MCH: 31.4 pg (ref 26.0–34.0)
MCHC: 34.1 g/dL (ref 30.0–36.0)
MCV: 92.3 fL (ref 80.0–100.0)
Monocytes Absolute: 0.8 10*3/uL (ref 0.1–1.0)
Monocytes Relative: 10 %
Neutro Abs: 4.2 10*3/uL (ref 1.7–7.7)
Neutrophils Relative %: 55 %
Platelets: 262 10*3/uL (ref 150–400)
RBC: 4.93 MIL/uL (ref 3.87–5.11)
RDW: 14.7 % (ref 11.5–15.5)
WBC: 7.7 10*3/uL (ref 4.0–10.5)
nRBC: 0 % (ref 0.0–0.2)

## 2021-07-12 LAB — COMPREHENSIVE METABOLIC PANEL
ALT: 32 U/L (ref 0–44)
AST: 24 U/L (ref 15–41)
Albumin: 3.7 g/dL (ref 3.5–5.0)
Alkaline Phosphatase: 70 U/L (ref 38–126)
Anion gap: 7 (ref 5–15)
BUN: 14 mg/dL (ref 8–23)
CO2: 24 mmol/L (ref 22–32)
Calcium: 9.4 mg/dL (ref 8.9–10.3)
Chloride: 106 mmol/L (ref 98–111)
Creatinine, Ser: 1.04 mg/dL — ABNORMAL HIGH (ref 0.44–1.00)
GFR, Estimated: 60 mL/min — ABNORMAL LOW (ref >60)
Glucose, Bld: 112 mg/dL — ABNORMAL HIGH (ref 70–99)
Potassium: 3.9 mmol/L (ref 3.5–5.1)
Sodium: 137 mmol/L (ref 135–145)
Total Bilirubin: 0.5 mg/dL (ref 0.3–1.2)
Total Protein: 7.4 g/dL (ref 6.5–8.1)

## 2021-07-12 LAB — PROTIME-INR
INR: 1 (ref 0.8–1.2)
Prothrombin Time: 13.4 seconds (ref 11.4–15.2)

## 2021-07-12 MED ORDER — IOHEXOL 300 MG/ML  SOLN
100.0000 mL | Freq: Once | INTRAMUSCULAR | Status: AC | PRN
  Administered 2021-07-12: 100 mL via INTRAVENOUS

## 2021-07-12 MED ORDER — HYDROCORTISONE (PERIANAL) 2.5 % EX CREA: 1.0000 "application " | TOPICAL_CREAM | Freq: Two times a day (BID) | CUTANEOUS | 0 refills | Status: AC

## 2021-07-12 MED ORDER — SODIUM CHLORIDE 0.9 % IV BOLUS
500.0000 mL | Freq: Once | INTRAVENOUS | Status: AC
  Administered 2021-07-12: 500 mL via INTRAVENOUS

## 2021-07-12 MED ORDER — HYDROCORTISONE (PERIANAL) 2.5 % EX CREA: 1.0000 "application " | TOPICAL_CREAM | Freq: Two times a day (BID) | CUTANEOUS | 0 refills | Status: DC

## 2021-07-12 NOTE — ED Provider Notes (Signed)
? ?Emergency Department Provider Note ? ? ?I have reviewed the triage vital signs and the nursing notes. ? ? ?HISTORY ? ?Chief Complaint ?Rectal Bleeding ? ? ?HPI ?Norma Miller is a 66 y.o. female with past medical history reviewed below presents the emergency department for evaluation of bright red blood in the toilet bowl over the past 24 hours.  Patient feels this is rectal bleeding.  She denies any urinary tract infection symptoms or obvious blood when she urinates.  She describes bright red blood and shows me pictures on her phone of 2 instances of bright red toilet water.  No melena by description.  Notes some mild discomfort in her left lower abdomen and notes a prior history of diverticulitis.  No fevers or vomiting.  She is not anticoagulated. ? ? ?Past Medical History:  ?Diagnosis Date  ? Asthma   ? Bursitis   ? Coronary artery disease   ? Diverticulitis of colon   ? Heart attack (HCC)   ? High cholesterol   ? Lupus (HCC)   ? ? ?Review of Systems ? ?Constitutional: No fever/chills ?Eyes: No visual changes. ?ENT: No sore throat. ?Cardiovascular: Denies chest pain. ?Respiratory: Denies shortness of breath. ?Gastrointestinal: Mild LLQ abdominal pain.  No nausea, no vomiting.  No diarrhea.  No constipation. Positive BRBPR. ?Genitourinary: Negative for dysuria. ?Musculoskeletal: Negative for back pain. ?Skin: Negative for rash. ?Neurological: Negative for headaches. ? ? ?____________________________________________ ? ? ?PHYSICAL EXAM: ? ?VITAL SIGNS: ?ED Triage Vitals  ?Enc Vitals Group  ?   BP 07/12/21 1952 (!) 141/78  ?   Pulse Rate 07/12/21 1952 68  ?   Resp 07/12/21 1952 16  ?   Temp 07/12/21 1952 98.6 ?F (37 ?C)  ?   Temp Source 07/12/21 1952 Oral  ?   SpO2 07/12/21 1952 100 %  ?   Weight 07/12/21 1956 192 lb (87.1 kg)  ?   Height 07/12/21 1956 5' (1.524 m)  ? ? ?Constitutional: Alert and oriented. Well appearing and in no acute distress. ?Eyes: Conjunctivae are normal.  ?Head: Atraumatic. ?Nose: No  congestion/rhinnorhea. ?Mouth/Throat: Mucous membranes are moist.   ?Neck: No stridor.   ?Cardiovascular: Normal rate, regular rhythm. Good peripheral circulation. Grossly normal heart sounds.   ?Respiratory: Normal respiratory effort.  No retractions. Lungs CTAB. ?Gastrointestinal: Soft and nontender. No distention. Rectal exam performed with patient's verbal consent and nurse chaperone.  Patient has external, nonthrombosed hemorrhoids.  No active bleeding or fissures.  ?Musculoskeletal:  No gross deformities of extremities. ?Neurologic:  Normal speech and language.  ?Skin:  Skin is warm, dry and intact. No rash noted. ? ?____________________________________________ ?  ?LABS ?(all labs ordered are listed, but only abnormal results are displayed) ? ?Labs Reviewed  ?COMPREHENSIVE METABOLIC PANEL - Abnormal; Notable for the following components:  ?    Result Value  ? Glucose, Bld 112 (*)   ? Creatinine, Ser 1.04 (*)   ? GFR, Estimated 60 (*)   ? All other components within normal limits  ?CBC WITH DIFFERENTIAL/PLATELET - Abnormal; Notable for the following components:  ? Hemoglobin 15.5 (*)   ? All other components within normal limits  ?PROTIME-INR  ? ?____________________________________________ ? ?RADIOLOGY ? ?CT ABDOMEN PELVIS W CONTRAST ? ?Result Date: 07/12/2021 ?CLINICAL DATA:  Left lower quadrant abdominal pain. EXAM: CT ABDOMEN AND PELVIS WITH CONTRAST TECHNIQUE: Multidetector CT imaging of the abdomen and pelvis was performed using the standard protocol following bolus administration of intravenous contrast. RADIATION DOSE REDUCTION: This exam was performed according  to the departmental dose-optimization program which includes automated exposure control, adjustment of the mA and/or kV according to patient size and/or use of iterative reconstruction technique. CONTRAST:  100mL OMNIPAQUE IOHEXOL 300 MG/ML  SOLN COMPARISON:  CT abdomen pelvis dated 02/19/2019. FINDINGS: Lower chest: The visualized lung bases are  clear. No intra-abdominal free air or free fluid. Hepatobiliary: The liver is unremarkable. No intrahepatic biliary ductal dilatation. Cholecystectomy. Pancreas: Unremarkable. No pancreatic ductal dilatation or surrounding inflammatory changes. Spleen: Normal in size without focal abnormality. Adrenals/Urinary Tract: The adrenal glands unremarkable. There is no hydronephrosis on either side. There is symmetric enhancement and excretion of contrast by both kidneys. The visualized ureters and urinary bladder appear unremarkable. Stomach/Bowel: There is sigmoid diverticulosis with scattered colonic diverticula. No active inflammatory changes. There is no bowel obstruction or active inflammation. The appendix is normal. Vascular/Lymphatic: Moderate aortoiliac atherosclerotic disease. The IVC is unremarkable. No portal venous gas. There is no adenopathy. Reproductive: Multiple uterine fibroids.  No adnexal masses. Other: None Musculoskeletal: There is widening of the symphysis pubis as well as bilateral sacroiliitis. No acute osseous pathology. IMPRESSION: 1. No acute intra-abdominal or pelvic pathology. 2. Colonic diverticulosis. No bowel obstruction. Normal appendix. 3. Uterine fibroids. 4. Aortic Atherosclerosis (ICD10-I70.0). Electronically Signed   By: Elgie CollardArash  Radparvar M.D.   On: 07/12/2021 22:32   ? ?____________________________________________ ? ? ?PROCEDURES ? ?Procedure(s) performed:  ? ?Procedures ? ?None  ?____________________________________________ ? ? ?INITIAL IMPRESSION / ASSESSMENT AND PLAN / ED COURSE ? ?Pertinent labs & imaging results that were available during my care of the patient were reviewed by me and considered in my medical decision making (see chart for details). ?  ?This patient is Presenting for Evaluation of abdominal pain, which does require a range of treatment options, and is a complaint that involves a high risk of morbidity and mortality. ? ?The Differential Diagnoses Differential  diagnosis includes but is not exclusive to ectopic pregnancy, ovarian cyst, ovarian torsion, acute appendicitis, urinary tract infection, endometriosis, bowel obstruction, hernia, colitis, renal colic, gastroenteritis, volvulus etc. ? ? ?Critical Interventions-  ?  ?Medications  ?sodium chloride 0.9 % bolus 500 mL ( Intravenous Stopped 07/12/21 2234)  ?iohexol (OMNIPAQUE) 300 MG/ML solution 100 mL (100 mLs Intravenous Contrast Given 07/12/21 2204)  ? ? ?Reassessment after intervention: Patient remains HDS. ? ? ?I decided to review pertinent External Data, and in summary no recent GI notes for review. ?  ?Clinical Laboratory Tests Ordered, included patient with hemoglobin of 15.5.  No leukocytosis.  No acute kidney injury. ? ?Radiologic Tests Ordered, included CT abdomen/pelvis. I independently interpreted the images and agree with radiology interpretation.  ? ?Cardiac Monitor Tracing which shows NSR. ? ? ?Social Determinants of Health Risk patient is a non-smoker. ? ?Medical Decision Making: Summary:  ?Patient presents emergency department with bright red blood per rectum.  On exam she has external hemorrhoids which are possible source of bleeding.  CT imaging obtained with her description of some left lower quadrant abdominal discomfort.  No focal tenderness on exam.  No diverticulitis on CT. patient is not anticoagulated and remains hemodynamically stable here.  Plan to treat for hemorrhoids and will have her follow closely with both her PCP and GI team.  ? ?Reevaluation with update and discussion with patient. Reviewed labs and imaging along with f/u plan and ED return precautions.  ? ?Disposition: discharge ? ?____________________________________________ ? ?FINAL CLINICAL IMPRESSION(S) / ED DIAGNOSES ? ?Final diagnoses:  ?Rectal bleeding  ? ? ? ?NEW OUTPATIENT MEDICATIONS STARTED DURING THIS  VISIT: ? ?Discharge Medication List as of 07/12/2021 10:53 PM  ?  ? ?START taking these medications  ? Details   ?hydrocortisone (ANUSOL-HC) 2.5 % rectal cream Place 1 application. rectally 2 (two) times daily for 10 days., Starting Tue 07/12/2021, Until Fri 07/22/2021, Normal  ?  ?  ? ? ?Note:  This document was prepared using Drag

## 2021-07-12 NOTE — ED Triage Notes (Signed)
Pt c/o bright red bleeding with stools started yesterday-NAD-steady gait ?

## 2021-07-12 NOTE — Discharge Instructions (Addendum)
There is seen in the emergency department today with rectal bleeding.  Your CT scan and blood work here is reassuring.  Please follow closely with your GI doctor and PCP.  I am starting you on hemorrhoid cream.  Return with any new or suddenly worsening symptoms. ?

## 2021-09-14 ENCOUNTER — Emergency Department (HOSPITAL_BASED_OUTPATIENT_CLINIC_OR_DEPARTMENT_OTHER): Payer: Medicare Other

## 2021-09-14 ENCOUNTER — Other Ambulatory Visit: Payer: Self-pay

## 2021-09-14 ENCOUNTER — Emergency Department (HOSPITAL_BASED_OUTPATIENT_CLINIC_OR_DEPARTMENT_OTHER)
Admission: EM | Admit: 2021-09-14 | Discharge: 2021-09-14 | Disposition: A | Discharge status: Home / Self Care | Payer: Medicare Other | Attending: Emergency Medicine | Admitting: Emergency Medicine

## 2021-09-14 ENCOUNTER — Encounter (HOSPITAL_BASED_OUTPATIENT_CLINIC_OR_DEPARTMENT_OTHER): Payer: Self-pay | Admitting: Emergency Medicine

## 2021-09-14 DIAGNOSIS — K5730 Diverticulosis of large intestine without perforation or abscess without bleeding: Secondary | ICD-10-CM | POA: Insufficient documentation

## 2021-09-14 DIAGNOSIS — R202 Paresthesia of skin: Secondary | ICD-10-CM | POA: Insufficient documentation

## 2021-09-14 DIAGNOSIS — J45909 Unspecified asthma, uncomplicated: Secondary | ICD-10-CM | POA: Diagnosis not present

## 2021-09-14 DIAGNOSIS — I2510 Atherosclerotic heart disease of native coronary artery without angina pectoris: Secondary | ICD-10-CM | POA: Diagnosis not present

## 2021-09-14 DIAGNOSIS — Z7951 Long term (current) use of inhaled steroids: Secondary | ICD-10-CM | POA: Diagnosis not present

## 2021-09-14 DIAGNOSIS — K5790 Diverticulosis of intestine, part unspecified, without perforation or abscess without bleeding: Secondary | ICD-10-CM

## 2021-09-14 DIAGNOSIS — K625 Hemorrhage of anus and rectum: Secondary | ICD-10-CM | POA: Diagnosis not present

## 2021-09-14 DIAGNOSIS — R200 Anesthesia of skin: Secondary | ICD-10-CM

## 2021-09-14 DIAGNOSIS — R1084 Generalized abdominal pain: Secondary | ICD-10-CM

## 2021-09-14 DIAGNOSIS — R109 Unspecified abdominal pain: Secondary | ICD-10-CM | POA: Diagnosis present

## 2021-09-14 LAB — COMPREHENSIVE METABOLIC PANEL
ALT: 32 U/L (ref 0–44)
AST: 24 U/L (ref 15–41)
Albumin: 3.8 g/dL (ref 3.5–5.0)
Alkaline Phosphatase: 68 U/L (ref 38–126)
Anion gap: 13 (ref 5–15)
BUN: 10 mg/dL (ref 8–23)
CO2: 24 mmol/L (ref 22–32)
Calcium: 9 mg/dL (ref 8.9–10.3)
Chloride: 103 mmol/L (ref 98–111)
Creatinine, Ser: 1.1 mg/dL — ABNORMAL HIGH (ref 0.44–1.00)
GFR, Estimated: 56 mL/min — ABNORMAL LOW (ref >60)
Glucose, Bld: 120 mg/dL — ABNORMAL HIGH (ref 70–99)
Potassium: 3.4 mmol/L — ABNORMAL LOW (ref 3.5–5.1)
Sodium: 140 mmol/L (ref 135–145)
Total Bilirubin: 1 mg/dL (ref 0.3–1.2)
Total Protein: 7.6 g/dL (ref 6.5–8.1)

## 2021-09-14 LAB — CBC WITH DIFFERENTIAL/PLATELET
Abs Immature Granulocytes: 0.02 10*3/uL (ref 0.00–0.07)
Basophils Absolute: 0 10*3/uL (ref 0.0–0.1)
Basophils Relative: 0 %
Eosinophils Absolute: 0 10*3/uL (ref 0.0–0.5)
Eosinophils Relative: 0 %
HCT: 45.8 % (ref 36.0–46.0)
Hemoglobin: 15.6 g/dL — ABNORMAL HIGH (ref 12.0–15.0)
Immature Granulocytes: 0 %
Lymphocytes Relative: 43 %
Lymphs Abs: 3.3 10*3/uL (ref 0.7–4.0)
MCH: 31.3 pg (ref 26.0–34.0)
MCHC: 34.1 g/dL (ref 30.0–36.0)
MCV: 92 fL (ref 80.0–100.0)
Monocytes Absolute: 0.6 10*3/uL (ref 0.1–1.0)
Monocytes Relative: 7 %
Neutro Abs: 3.8 10*3/uL (ref 1.7–7.7)
Neutrophils Relative %: 50 %
Platelets: 260 10*3/uL (ref 150–400)
RBC: 4.98 MIL/uL (ref 3.87–5.11)
RDW: 14.4 % (ref 11.5–15.5)
WBC: 7.8 10*3/uL (ref 4.0–10.5)
nRBC: 0 % (ref 0.0–0.2)

## 2021-09-14 LAB — URINALYSIS, MICROSCOPIC (REFLEX)
Bacteria, UA: NONE SEEN
WBC, UA: NONE SEEN WBC/hpf (ref 0–5)

## 2021-09-14 LAB — URINALYSIS, ROUTINE W REFLEX MICROSCOPIC
Bilirubin Urine: NEGATIVE
Glucose, UA: >=500 mg/dL — AB
Hgb urine dipstick: NEGATIVE
Ketones, ur: 15 mg/dL — AB
Leukocytes,Ua: NEGATIVE
Nitrite: NEGATIVE
Protein, ur: NEGATIVE mg/dL
Specific Gravity, Urine: 1.01 (ref 1.005–1.030)
pH: 7 (ref 5.0–8.0)

## 2021-09-14 LAB — MAGNESIUM: Magnesium: 1.8 mg/dL (ref 1.7–2.4)

## 2021-09-14 LAB — LIPASE, BLOOD: Lipase: 33 U/L (ref 11–51)

## 2021-09-14 MED ORDER — IOHEXOL 300 MG/ML  SOLN
100.0000 mL | Freq: Once | INTRAMUSCULAR | Status: AC | PRN
  Administered 2021-09-14: 100 mL via INTRAVENOUS

## 2021-09-14 MED ORDER — MORPHINE SULFATE (PF) 4 MG/ML IV SOLN
4.0000 mg | Freq: Once | INTRAVENOUS | Status: AC
  Administered 2021-09-14: 4 mg via INTRAVENOUS
  Filled 2021-09-14: qty 1

## 2021-09-14 MED ORDER — LORAZEPAM 2 MG/ML IJ SOLN
0.5000 mg | Freq: Once | INTRAMUSCULAR | Status: AC
  Administered 2021-09-14: 0.5 mg via INTRAVENOUS
  Filled 2021-09-14: qty 1

## 2021-09-14 MED ORDER — ONDANSETRON HCL 4 MG/2ML IJ SOLN
4.0000 mg | Freq: Once | INTRAMUSCULAR | Status: AC
  Administered 2021-09-14: 4 mg via INTRAVENOUS
  Filled 2021-09-14: qty 2

## 2021-09-14 MED ORDER — SODIUM CHLORIDE 0.9 % IV BOLUS
1000.0000 mL | Freq: Once | INTRAVENOUS | Status: AC
  Administered 2021-09-14: 1000 mL via INTRAVENOUS

## 2021-09-14 MED ORDER — ONDANSETRON 4 MG PO TBDP
4.0000 mg | ORAL_TABLET | Freq: Three times a day (TID) | ORAL | 0 refills | Status: AC | PRN
Start: 2021-09-14 — End: ?

## 2021-09-14 MED ORDER — HYDROCODONE-ACETAMINOPHEN 5-325 MG PO TABS: 1.0000 | ORAL_TABLET | ORAL | 0 refills | Status: AC | PRN

## 2021-09-14 NOTE — ED Triage Notes (Signed)
Facial numbness last night , Hx anxiety , on meds yet did not take it ; Reports Hx palpitation, took atenolol today priro to arrival . Denies chest pain or weakness to extremities .  Reports abdominal pain x 1 days  and believes it causing her anxiety .

## 2021-09-14 NOTE — ED Notes (Signed)
Pt A&OX4 ambulatory at d/c with independent steady gait, declined wheelchair offer. Pt verbalized understanding of d/c instructions, prescriptions and follow up care.

## 2021-09-14 NOTE — Discharge Instructions (Addendum)
If you have any worsening of the facial numbness, any weakness, trouble speaking, then go to [facility].

## 2021-09-14 NOTE — ED Provider Notes (Signed)
[facility] EMERGENCY DEPARTMENT Provider Note   CSN: [id number] Arrival date & time: 09/14/21  1135     History {Add pertinent medical, surgical, social history, OB history to HPI:1} Chief Complaint  Patient presents with   facial numbness    Norma Miller is a 66 y.o. female.  Pt is a 66 yo female with a pmhx significant for anxiety, diverticulitis, asthma, high cholesterol, Lupus, and CAD.  Pt has been having intermittent abd pain and nausea with rectal bleeding starting last week.  Last night, it was much worse.  She said her doctor told her not to eat, so she has been eating very little.  She is followed by GI for her diverticulosis.  She last saw PA Raechel Ache on 08/04/21 for the rectal bleeding.  They offered another colonoscopy (last was in 2020), but she elected to wait.  Pt also has some numbness in her left face, hands, lips, and rectum.  She denies weakness, difficulty seeing, or speaking.  She is able to ambulate without difficulty.      Home Medications Prior to Admission medications   Medication Sig Start Date End Date Taking? Authorizing Provider  HYDROcodone-acetaminophen (NORCO/VICODIN) 5-325 MG tablet Take 1 tablet by mouth every 4 (four) hours as needed. 09/14/21  Yes Isla Pence, MD  ondansetron (ZOFRAN-ODT) 4 MG disintegrating tablet Take 1 tablet (4 mg total) by mouth every 8 (eight) hours as needed for nausea or vomiting. 09/14/21  Yes Isla Pence, MD  beclomethasone (QVAR) 80 MCG/ACT inhaler Inhale 1 puff into the lungs 2 (two) times daily. 05/04/15   Charlies Silvers, MD  carvedilol (COREG) 3.125 MG tablet Take 2 tablets (6.25 mg total) by mouth 2 (two) times daily with a meal. 02/04/18 03/06/18  Gareth Morgan, MD  lansoprazole (PREVACID) 15 MG capsule Take 15 mg by mouth daily.    [provider]  levalbuterol Penne Lash HFA) 45 MCG/ACT inhaler Inhale into the lungs every 4 (four) hours as needed for wheezing.    [provider]  levalbuterol Penne Lash) 1.25 MG/3ML nebulizer solution Take 1.25 mg by nebulization every 6 (six) hours as needed for wheezing. 05/04/15   Charlies Silvers, MD  potassium chloride (K-DUR) 10 MEQ tablet Take 2 tablets (20 mEq total) by mouth 2 (two) times daily. 07/31/11 07/30/12  Molpus, John, MD  sertraline (ZOLOFT) 25 MG tablet Take 25 mg by mouth daily.    [provider]  sulfaSALAzine (AZULFIDINE) 500 MG tablet Take 500 mg by mouth 4 (four) times daily.    [provider]      Allergies    Welchol [colesevelam hcl], Azithromycin, Cephalexin, Compazine, Erythromycin, Flucytosine, Metoprolol tartrate, Nitrofurantoin, and Nitrofurantoin monohyd macro    Review of Systems   Review of Systems  Gastrointestinal:  Positive for abdominal pain.  Neurological:  Positive for numbness.  All other systems reviewed and are negative.  Physical Exam Updated Vital Signs BP 123/71   Pulse (!) 55   Temp 98.6 F (37 C) (Oral)   Resp 15   Ht 5' (1.524 m)   Wt 87.1 kg   SpO2 97%   BMI 37.50 kg/m  Physical Exam Vitals and nursing note reviewed.  Constitutional:      Appearance: Normal appearance.  HENT:     Head: Normocephalic and atraumatic.     Right Ear: External ear normal.     Left Ear: External ear normal.     Nose: Nose normal.     Mouth/Throat:  Mouth: Mucous membranes are dry.  Eyes:     Extraocular Movements: Extraocular movements intact.     Conjunctiva/sclera: Conjunctivae normal.     Pupils: Pupils are equal, round, and reactive to light.  Cardiovascular:     Rate and Rhythm: Normal rate and regular rhythm.     Pulses: Normal pulses.     Heart sounds: Normal heart sounds.  Pulmonary:     Effort: Pulmonary effort is normal.     Breath sounds: Normal breath sounds.  Abdominal:     General: Abdomen is flat. Bowel sounds are normal.     Palpations: Abdomen is soft.     Tenderness: There is abdominal tenderness in the left lower quadrant.   Musculoskeletal:        General: Normal range of motion.     Cervical back: Normal range of motion and neck supple.  Skin:    General: Skin is warm.     Capillary Refill: Capillary refill takes less than 2 seconds.  Neurological:     General: No focal deficit present.     Mental Status: She is alert and oriented to person, place, and time.  Psychiatric:        Mood and Affect: Mood normal.        Behavior: Behavior normal.    ED Results / Procedures / Treatments   Labs (all labs ordered are listed, but only abnormal results are displayed) Labs Reviewed  CBC WITH DIFFERENTIAL/PLATELET - Abnormal; Notable for the following components:      Result Value   Hemoglobin 15.6 (*)    All other components within normal limits  COMPREHENSIVE METABOLIC PANEL - Abnormal; Notable for the following components:   Potassium 3.4 (*)    Glucose, Bld 120 (*)    Creatinine, Ser 1.10 (*)    GFR, Estimated 56 (*)    All other components within normal limits  URINALYSIS, ROUTINE W REFLEX MICROSCOPIC - Abnormal; Notable for the following components:   Glucose, UA >=500 (*)    Ketones, ur 15 (*)    All other components within normal limits  LIPASE, BLOOD  MAGNESIUM  URINALYSIS, MICROSCOPIC (REFLEX)    EKG None  Radiology CT Head Wo Contrast  Result Date: 09/14/2021 CLINICAL DATA:  Neuro deficit, acute, stroke suspected EXAM: CT HEAD WITHOUT CONTRAST TECHNIQUE: Contiguous axial images were obtained from the base of the skull through the vertex without intravenous contrast. RADIATION DOSE REDUCTION: This exam was performed according to the departmental dose-optimization program which includes automated exposure control, adjustment of the mA and/or kV according to patient size and/or use of iterative reconstruction technique. COMPARISON:  July 2017 FINDINGS: Brain: There is no acute intracranial hemorrhage, mass effect, or edema. Gray-white differentiation is preserved. There is no extra-axial fluid  collection. Ventricles and sulci are within normal limits in size and configuration. Vascular: There is mild atherosclerotic calcification at the skull base. Skull: Calvarium is unremarkable. Sinuses/Orbits: No acute finding. Other: Expanded, "empty" sella.  Mastoid air cells are clear. IMPRESSION: No acute intracranial abnormality. Electronically Signed   By: Macy Mis M.D.   On: 09/14/2021 13:21   CT Abdomen Pelvis W Contrast  Result Date: 09/14/2021 CLINICAL DATA:  Left lower quadrant abdominal pain. EXAM: CT ABDOMEN AND PELVIS WITH CONTRAST TECHNIQUE: Multidetector CT imaging of the abdomen and pelvis was performed using the standard protocol following bolus administration of intravenous contrast. RADIATION DOSE REDUCTION: This exam was performed according to the departmental dose-optimization program which includes automated exposure control,  adjustment of the mA and/or kV according to patient size and/or use of iterative reconstruction technique. CONTRAST:  OMNIPAQUE IOHEXOL 300 MG/ML  SOLN COMPARISON:  CT abdomen and pelvis 07/12/2021 FINDINGS: Lower chest: Mild ground-glass subsegmental atelectasis within the left-greater-than-right lung bases. Heart size is again borderline enlarged. No pericardial effusion. Hepatobiliary: Liver demonstrates smooth contours. Status post cholecystectomy. No focal liver lesion is seen. Pancreas: Unremarkable. No pancreatic ductal dilatation or surrounding inflammatory changes. Spleen: Normal in size without focal abnormality. Adrenals/Urinary Tract: Normal adrenals. The kidneys enhance uniformly and are symmetric in size without hydronephrosis. The urinary bladder is only mildly distended but grossly unremarkable. Stomach/Bowel: Moderate to high-grade sigmoid and moderate distal descending colon diverticulosis. Mild scattered diverticula throughout the more proximal colon. Moderate ascending colon diverticulosis. The terminal ileum is unremarkable. Normal  appendix (axial series 2 images 49 through 52). There are diverticula again seen within the distal ileum. No dilated loops of bowel to indicate bowel obstruction. Vascular/Lymphatic: No abdominal aortic aneurysm. Moderate atherosclerotic calcifications. The major intra-abdominal aortic branch vessels are patent. No mesenteric, retroperitoneal, or pelvic pathologically enlarged lymph nodes by CT criteria. Reproductive: Moderate uterine enlargement with diffuse heterogeneity and multiple fibroids. Dystrophic calcification within the anterior superior uterine fundus measuring up to 2.4 cm again likely related to uterine fibroid. No gross adnexal abnormality. Other: Small fat containing umbilical hernia. No free air or free fluid is seen within the abdomen or pelvis. Musculoskeletal: Moderate bilateral sacroiliac joint osteoarthritis. Unchanged widening of the pubic symphysis, likely from remote trauma. IMPRESSION: 1. No significant change compared to 07/12/2021 CT. No acute intra abdominopelvic pathology. 2. Moderate to high-grade sigmoid and moderate distal descending colon and ascending colon diverticulosis. Again no inflammatory changes to indicate acute diverticulitis. 3. Normal appendix. 4. Uterine fibroids. Electronically Signed   By: Neita Garnet M.D.   On: 09/14/2021 13:26    Procedures Procedures  {Document cardiac monitor, telemetry assessment procedure when appropriate:1}  Medications Ordered in ED Medications  sodium chloride 0.9 % bolus 1,000 mL (0 mLs Intravenous Stopped 09/14/21 1409)  ondansetron (ZOFRAN) injection 4 mg (4 mg Intravenous Given 09/14/21 1235)  morphine (PF) 4 MG/ML injection 4 mg (4 mg Intravenous Given 09/14/21 1239)  LORazepam (ATIVAN) injection 0.5 mg (0.5 mg Intravenous Given 09/14/21 1236)  iohexol (OMNIPAQUE) 300 MG/ML solution 100 mL (100 mLs Intravenous Contrast Given 09/14/21 1256)    ED Course/ Medical Decision Making/ A&P                           Medical  Decision Making Amount and/or Complexity of Data Reviewed Labs: ordered. Radiology: ordered.  Risk Prescription drug management.   This patient presents to the ED for concern of abd pain, this involves an extensive number of treatment options, and is a complaint that carries with it a high risk of complications and morbidity.  The differential diagnosis includes diverticulitis, uti, infection, electrolyte abn   Co morbidities that complicate the patient evaluation  nxiety, diverticulitis, asthma, high cholesterol, Lupus, and CAD   Additional history obtained:  Additional history obtained from epic chart review    Lab Tests:  I Ordered, and personally interpreted labs.  The pertinent results include:  cbc nl; cmp with K slightly low at 3.4; mg nl at 1.8; lip nl   Imaging Studies ordered:  I ordered imaging studies including ct head/ct abd/pelvis  I independently visualized and interpreted imaging which showed  CT head:   IMPRESSION:  No acute  intracranial abnormality.  CT abd/pelvis:   IMPRESSION:  1. No significant change compared to 07/12/2021 CT. No acute intra  abdominopelvic pathology.  2. Moderate to high-grade sigmoid and moderate distal descending  colon and ascending colon diverticulosis. Again no inflammatory  changes to indicate acute diverticulitis.  3. Normal appendix.  4. Uterine fibroids.   I agree with the radiologist interpretation   Cardiac Monitoring:  The patient was maintained on a cardiac monitor.  I personally viewed and interpreted the cardiac monitored which showed an underlying rhythm of: nsr   Medicines ordered and prescription drug management:  I ordered medication including morphine  for ***  Reevaluation of the patient after these medicines showed that the patient {resolved/improved/worsened:23923::"improved"} I have reviewed the patients home medicines and have made adjustments as needed   Test  Considered:  ***   Critical Interventions:  ***   Consultations Obtained:  I requested consultation with the ***,  and discussed lab and imaging findings as well as pertinent plan - they recommend: ***   Problem List / ED Course:  ***   Reevaluation:  After the interventions noted above, I reevaluated the patient and found that they have :{resolved/improved/worsened:23923::"improved"}   Social Determinants of Health:  ***   Dispostion:  After consideration of the diagnostic results and the patients response to treatment, I feel that the patent would benefit from ***.    {Document critical care time when appropriate:1} {Document review of labs and clinical decision tools ie heart score, Chads2Vasc2 etc:1}  {Document your independent review of radiology images, and any outside records:1} {Document your discussion with family members, caretakers, and with consultants:1} {Document social determinants of health affecting pt's care:1} {Document your decision making why or why not admission, treatments were needed:1} Final Clinical Impression(s) / ED Diagnoses Final diagnoses:  Generalized abdominal pain  Left facial numbness  Diverticulosis    Rx / DC Orders ED Discharge Orders          Ordered    HYDROcodone-acetaminophen (NORCO/VICODIN) 5-325 MG tablet  Every 4 hours PRN        09/14/21 1515    ondansetron (ZOFRAN-ODT) 4 MG disintegrating tablet  Every 8 hours PRN        09/14/21 1515

## 2023-04-07 IMAGING — CT CT HEAD W/O CM
3 series · 14 of 47 positions shown, 16 images · non-contrast
Comparison: October 2015

CLINICAL DATA: Neuro deficit, acute, stroke suspected



[Series 2: head wo · axial · 0.45mm/px · z∈[+952,+1078]mm · 8 of 30 slices shown, 10 images]
[im 3/30  brain]
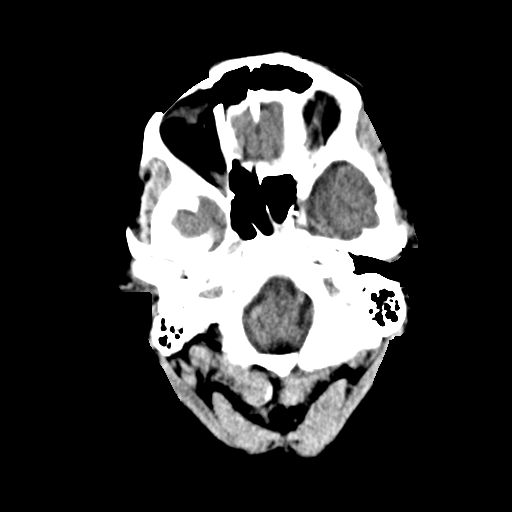
[im 3/30  bone]
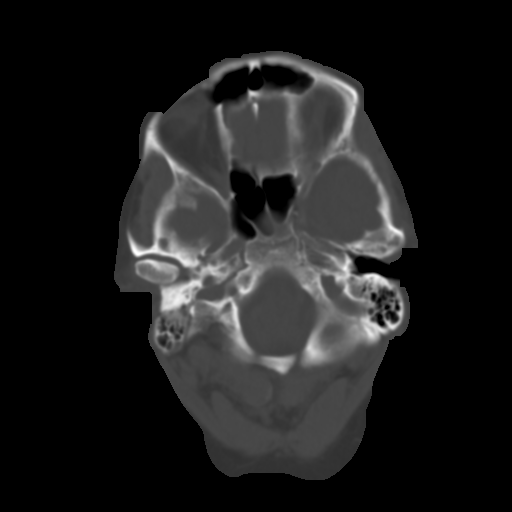
[im 7/30  brain]
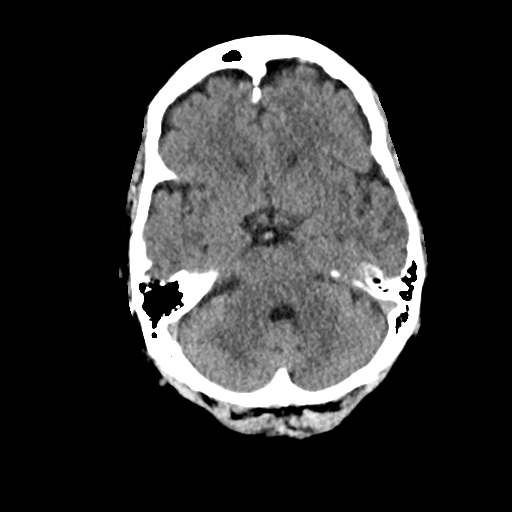
[im 10/30  brain]
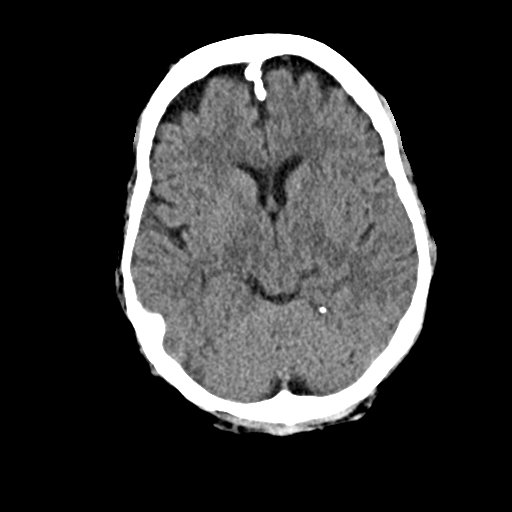
[im 14/30  brain]
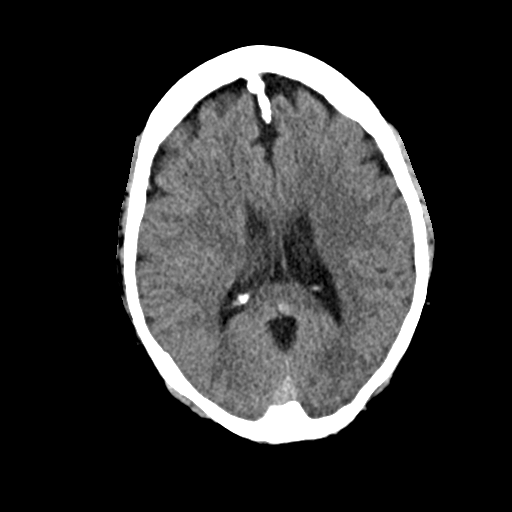
[im 17/30  brain]
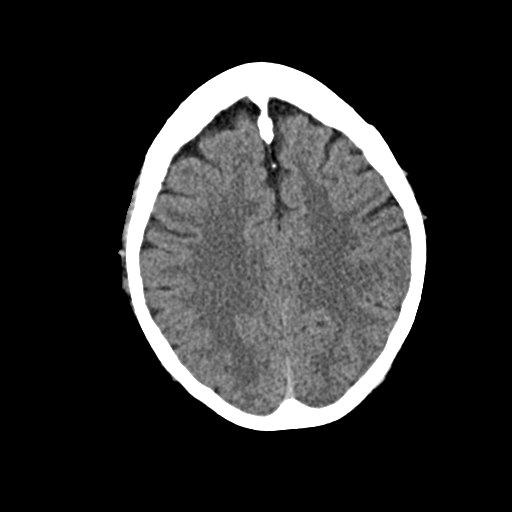
[im 17/30  bone]
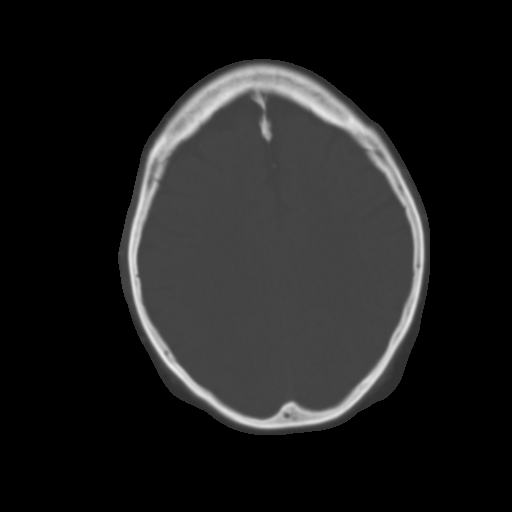
[im 21/30  brain]
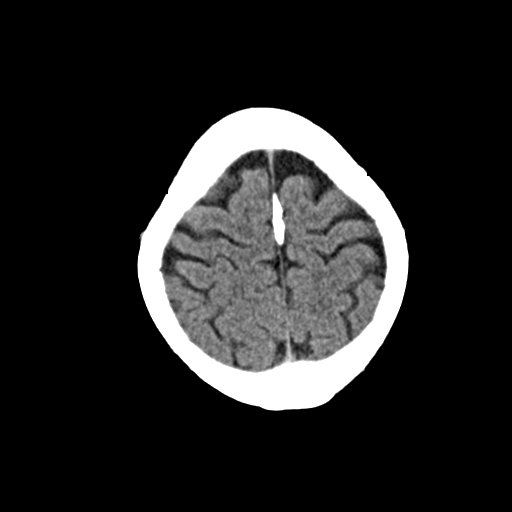
[im 24/30  brain]
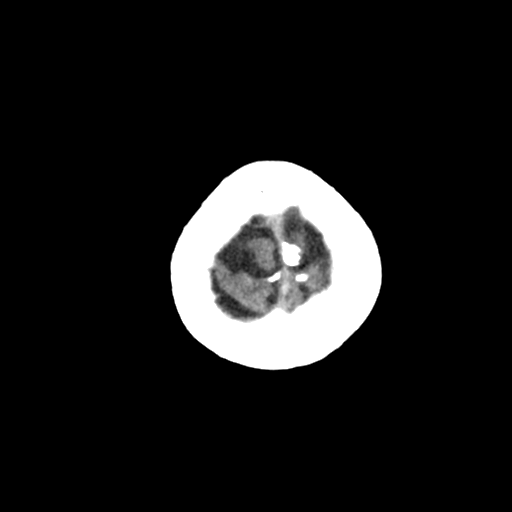
[im 28/30  brain]
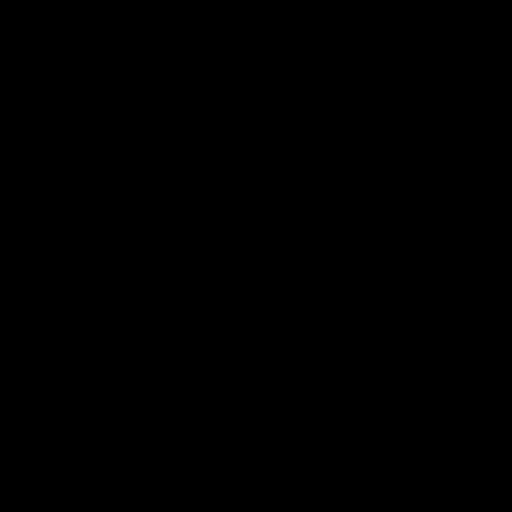

[Series 4: coronal soft · coronal · 0.29mm/px · 3 of 70 slices shown]
[im 24/70  brain]
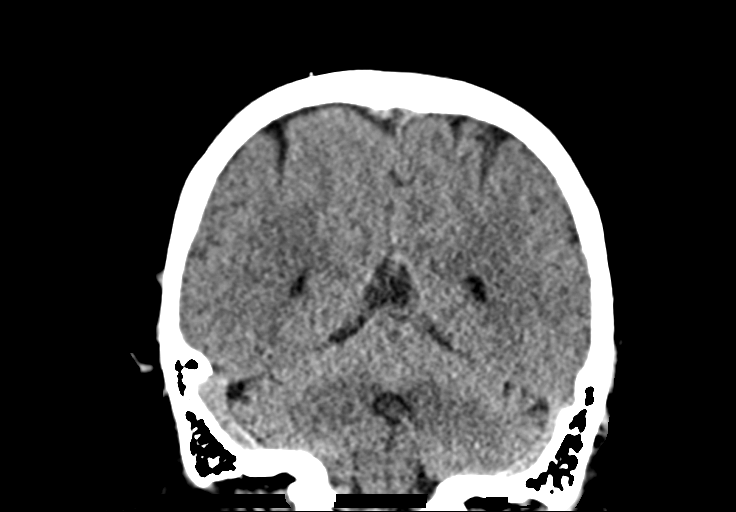
[im 31/70  brain]
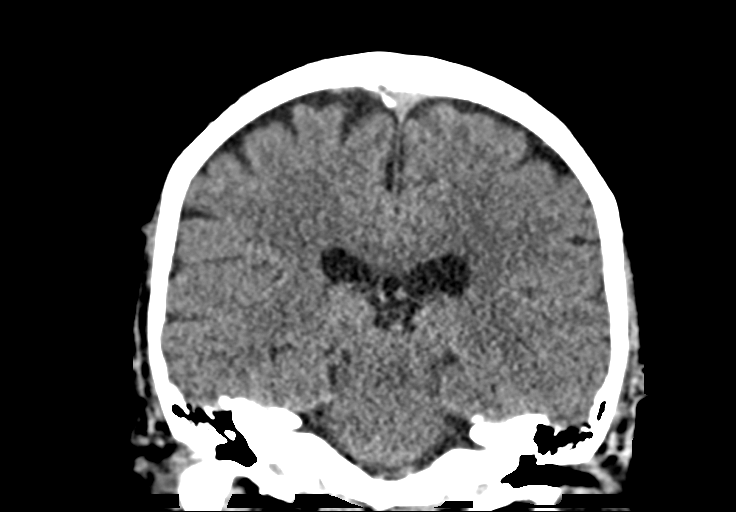
[im 39/70  brain]
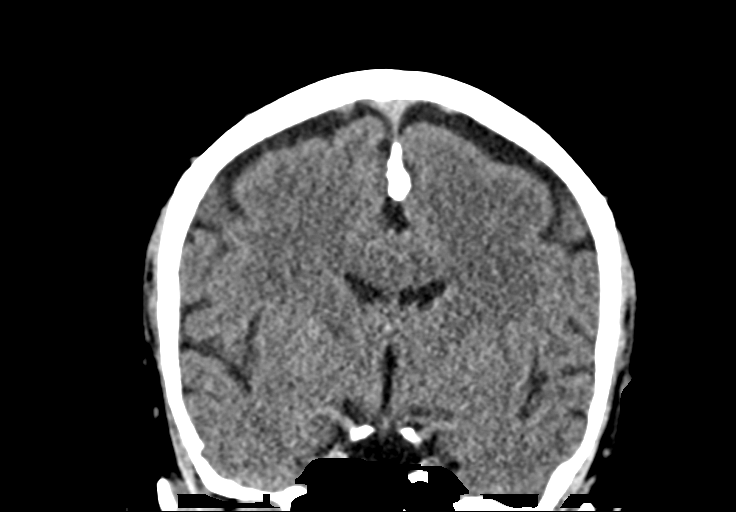

[Series 5: sag soft · sagittal · 0.30mm/px · 3 of 67 slices shown]
[im 23/67  brain]
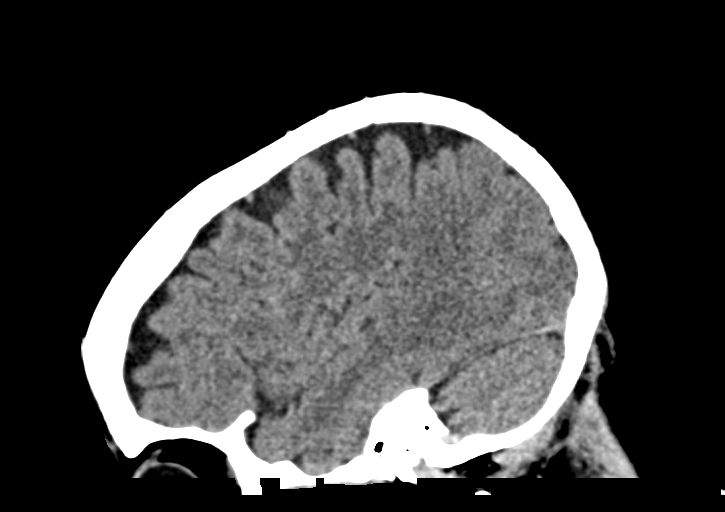
[im 34/67  brain]
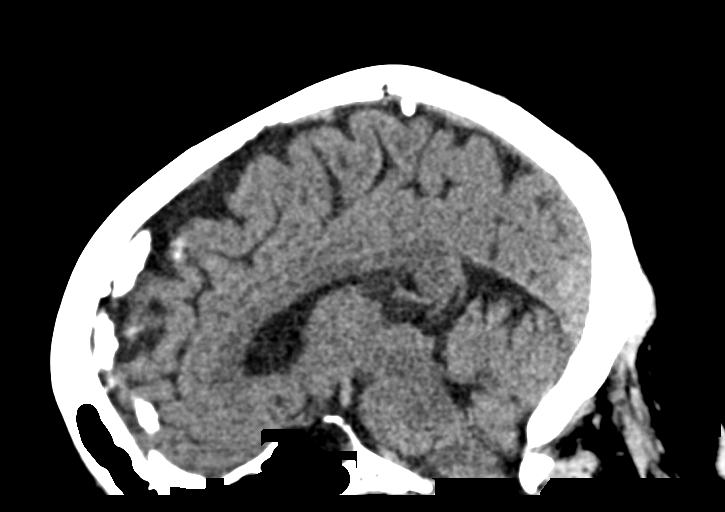
[im 45/67  brain]
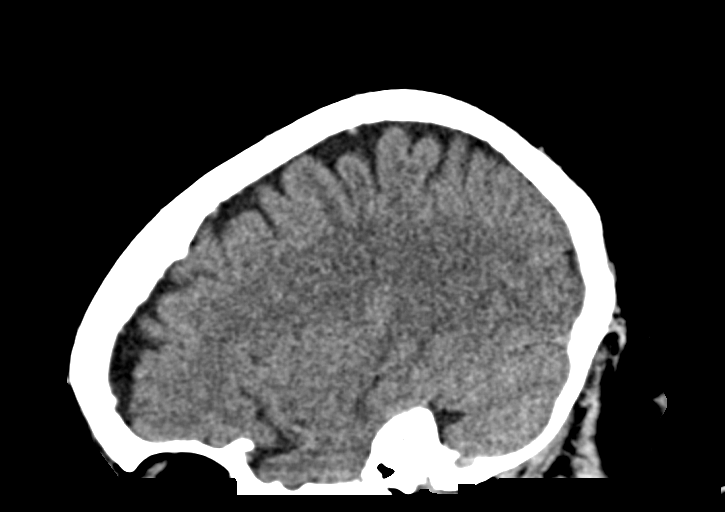

[14 of 47 positions shown; findings below may reference images not displayed]

FINDINGS: Brain: There is no acute intracranial hemorrhage, mass effect, or
edema. Gray-white differentiation is preserved. There is no
extra-axial fluid collection. Ventricles and sulci are within normal
limits in size and configuration.

Vascular: There is mild atherosclerotic calcification at the skull
base.

Skull: Calvarium is unremarkable.

Sinuses/Orbits: No acute finding.

Other: Expanded, "empty" sella.  Mastoid air cells are clear.
IMPRESSION: No acute intracranial abnormality.
